# Patient Record
Sex: Female | Born: 1975
Health system: Southern US, Community
[De-identification: ages and names within clinical notes are randomized; demographics above are authoritative.]

## PROBLEM LIST (undated history)

## (undated) DIAGNOSIS — Z789 Other specified health status: Secondary | ICD-10-CM

## (undated) HISTORY — PX: MANDIBLE RECONSTRUCTION: SHX431

## (undated) HISTORY — PX: WISDOM TOOTH EXTRACTION: SHX21

---

## 2002-02-22 ENCOUNTER — Other Ambulatory Visit: Admission: RE | Admit: 2002-02-22 | Discharge: 2002-02-22 | Payer: Self-pay | Admitting: Obstetrics and Gynecology

## 2003-02-27 ENCOUNTER — Other Ambulatory Visit: Admission: RE | Admit: 2003-02-27 | Discharge: 2003-02-27 | Payer: Self-pay | Admitting: Obstetrics and Gynecology

## 2004-02-27 ENCOUNTER — Other Ambulatory Visit: Admission: RE | Admit: 2004-02-27 | Discharge: 2004-02-27 | Payer: Self-pay | Admitting: Obstetrics and Gynecology

## 2005-02-26 ENCOUNTER — Other Ambulatory Visit: Admission: RE | Admit: 2005-02-26 | Discharge: 2005-02-26 | Payer: Self-pay | Admitting: Obstetrics and Gynecology

## 2005-05-31 ENCOUNTER — Inpatient Hospital Stay (HOSPITAL_COMMUNITY): Admission: AD | Admit: 2005-05-31 | Discharge: 2005-06-03 | Payer: Self-pay | Admitting: Obstetrics and Gynecology

## 2006-03-04 ENCOUNTER — Other Ambulatory Visit: Admission: RE | Admit: 2006-03-04 | Discharge: 2006-03-04 | Payer: Self-pay | Admitting: Obstetrics and Gynecology

## 2011-02-12 LAB — GC/CHLAMYDIA PROBE AMP, GENITAL

## 2011-02-12 LAB — RPR: RPR: NONREACTIVE

## 2011-02-12 LAB — HEPATITIS B SURFACE ANTIGEN: Hepatitis B Surface Ag: NEGATIVE

## 2011-06-30 ENCOUNTER — Inpatient Hospital Stay (HOSPITAL_COMMUNITY)
Admission: AD | Admit: 2011-06-30 | Discharge: 2011-06-30 | Disposition: A | Discharge status: Home / Self Care | Payer: 59 | Source: Ambulatory Visit | Attending: Obstetrics and Gynecology | Admitting: Obstetrics and Gynecology

## 2011-06-30 DIAGNOSIS — Z2989 Encounter for other specified prophylactic measures: Secondary | ICD-10-CM | POA: Insufficient documentation

## 2011-06-30 DIAGNOSIS — Z3480 Encounter for supervision of other normal pregnancy, unspecified trimester: Secondary | ICD-10-CM | POA: Insufficient documentation

## 2011-06-30 DIAGNOSIS — Z298 Encounter for other specified prophylactic measures: Secondary | ICD-10-CM | POA: Insufficient documentation

## 2011-06-30 MED ORDER — RHO D IMMUNE GLOBULIN 1500 UNIT/2ML IJ SOLN
300.0000 ug | Freq: Once | INTRAMUSCULAR | Status: AC
  Administered 2011-06-30: 300 ug via INTRAMUSCULAR
  Filled 2011-06-30: qty 2

## 2011-06-30 NOTE — Plan of Care (Signed)
Rhophylac information booklet given to the patient. Explained the time required to process this order of 1 1/2 hours. Pt understands.

## 2011-07-01 LAB — RH IG WORKUP (INCLUDES ABO/RH)
ABO/RH(D): O NEG
Antibody Screen: NEGATIVE
Unit division: 0

## 2011-09-14 NOTE — L&D Delivery Note (Signed)
Delivery Note G2P1 40 6/7At 7:49 PM a viable female was delivered via Vaginal, Spontaneous Delivery (Presentation: ; Left Occiput Anterior).  APGAR:8 , 9; weight not done yet Placenta status: Intact, Spontaneous schultze marginal insertion.  Cord: 3 vessels. Anesthesia: Epidural  Episiotomy: None Lacerations: None hospitala cord blood collection done with Dr. Stefano Gaul attending. GBS+  Est. Blood Loss (mL): 300  Mom to postpartum.  Baby to rooming in.  Rachael King 09/23/2011, 8:10 PM

## 2011-09-23 ENCOUNTER — Inpatient Hospital Stay (HOSPITAL_COMMUNITY)
Admission: AD | Admit: 2011-09-23 | Discharge: 2011-09-25 | DRG: 775 | Disposition: A | Discharge status: Home / Self Care | Payer: 59 | Source: Ambulatory Visit | Attending: Obstetrics and Gynecology | Admitting: Obstetrics and Gynecology

## 2011-09-23 ENCOUNTER — Encounter (HOSPITAL_COMMUNITY): Payer: Self-pay | Admitting: Anesthesiology

## 2011-09-23 ENCOUNTER — Encounter (HOSPITAL_COMMUNITY): Payer: Self-pay | Admitting: Obstetrics and Gynecology

## 2011-09-23 ENCOUNTER — Encounter (HOSPITAL_COMMUNITY): Payer: Self-pay

## 2011-09-23 ENCOUNTER — Inpatient Hospital Stay (HOSPITAL_COMMUNITY): Payer: 59 | Admitting: Anesthesiology

## 2011-09-23 ENCOUNTER — Telehealth (HOSPITAL_COMMUNITY): Payer: Self-pay | Admitting: *Deleted

## 2011-09-23 DIAGNOSIS — Z6791 Unspecified blood type, Rh negative: Secondary | ICD-10-CM | POA: Diagnosis present

## 2011-09-23 DIAGNOSIS — O09529 Supervision of elderly multigravida, unspecified trimester: Secondary | ICD-10-CM | POA: Diagnosis present

## 2011-09-23 DIAGNOSIS — O4290 Premature rupture of membranes, unspecified as to length of time between rupture and onset of labor, unspecified weeks of gestation: Principal | ICD-10-CM | POA: Diagnosis present

## 2011-09-23 DIAGNOSIS — Z3490 Encounter for supervision of normal pregnancy, unspecified, unspecified trimester: Secondary | ICD-10-CM

## 2011-09-23 DIAGNOSIS — IMO0002 Reserved for concepts with insufficient information to code with codable children: Secondary | ICD-10-CM | POA: Diagnosis present

## 2011-09-23 HISTORY — DX: Other specified health status: Z78.9

## 2011-09-23 LAB — CBC
MCV: 95.3 fL (ref 78.0–100.0)
Platelets: 172 10*3/uL (ref 150–400)
RBC: 3.8 MIL/uL — ABNORMAL LOW (ref 3.87–5.11)
RDW: 13.9 % (ref 11.5–15.5)
WBC: 12 10*3/uL — ABNORMAL HIGH (ref 4.0–10.5)

## 2011-09-23 LAB — GC/CHLAMYDIA PROBE AMP, GENITAL

## 2011-09-23 LAB — RPR: RPR Ser Ql: NONREACTIVE

## 2011-09-23 LAB — AMNISURE RUPTURE OF MEMBRANE (ROM) NOT AT ARMC: Amnisure ROM: POSITIVE

## 2011-09-23 MED ORDER — ZOLPIDEM TARTRATE 5 MG PO TABS: 5.0000 mg | ORAL_TABLET | Freq: Every evening | ORAL | Status: DC | PRN

## 2011-09-23 MED ORDER — ONDANSETRON HCL 4 MG/2ML IJ SOLN: 4.0000 mg | Freq: Four times a day (QID) | INTRAMUSCULAR | Status: DC | PRN

## 2011-09-23 MED ORDER — LIDOCAINE HCL 1.5 % IJ SOLN
INTRAMUSCULAR | Status: DC | PRN
  Administered 2011-09-23 (×2): 5 mL via EPIDURAL

## 2011-09-23 MED ORDER — DIPHENHYDRAMINE HCL 25 MG PO CAPS: 25.0000 mg | ORAL_CAPSULE | Freq: Four times a day (QID) | ORAL | Status: DC | PRN

## 2011-09-23 MED ORDER — PRENATAL MULTIVITAMIN CH
1.0000 | ORAL_TABLET | Freq: Every day | ORAL | Status: DC
Start: 2011-09-24 — End: 2011-09-25
  Administered 2011-09-24 – 2011-09-25 (×2): 1 via ORAL
  Filled 2011-09-23 (×2): qty 1

## 2011-09-23 MED ORDER — CITRIC ACID-SODIUM CITRATE 334-500 MG/5ML PO SOLN: 30.0000 mL | ORAL | Status: DC | PRN

## 2011-09-23 MED ORDER — FENTANYL 2.5 MCG/ML BUPIVACAINE 1/10 % EPIDURAL INFUSION (WH - ANES)
INTRAMUSCULAR | Status: DC | PRN
  Administered 2011-09-23: 14 mL/h via EPIDURAL

## 2011-09-23 MED ORDER — PHENYLEPHRINE 40 MCG/ML (10ML) SYRINGE FOR IV PUSH (FOR BLOOD PRESSURE SUPPORT): 80.0000 ug | PREFILLED_SYRINGE | INTRAVENOUS | Status: DC | PRN

## 2011-09-23 MED ORDER — ONDANSETRON HCL 4 MG/2ML IJ SOLN: 4.0000 mg | INTRAMUSCULAR | Status: DC | PRN

## 2011-09-23 MED ORDER — LACTATED RINGERS IV SOLN: 500.0000 mL | Freq: Once | INTRAVENOUS | Status: DC

## 2011-09-23 MED ORDER — IBUPROFEN 600 MG PO TABS: 600.0000 mg | ORAL_TABLET | Freq: Four times a day (QID) | ORAL | Status: DC | PRN

## 2011-09-23 MED ORDER — OXYTOCIN 20 UNITS IN LACTATED RINGERS INFUSION - SIMPLE
125.0000 mL/h | Freq: Once | INTRAVENOUS | Status: AC
  Administered 2011-09-23: 999 mL/h via INTRAVENOUS

## 2011-09-23 MED ORDER — WITCH HAZEL-GLYCERIN EX PADS: 1.0000 "application " | MEDICATED_PAD | CUTANEOUS | Status: DC | PRN

## 2011-09-23 MED ORDER — ONDANSETRON HCL 4 MG PO TABS: 4.0000 mg | ORAL_TABLET | ORAL | Status: DC | PRN

## 2011-09-23 MED ORDER — OXYTOCIN 20 UNITS IN LACTATED RINGERS INFUSION - SIMPLE
1.0000 m[IU]/min | INTRAVENOUS | Status: DC
  Administered 2011-09-23: 2 m[IU]/min via INTRAVENOUS
  Filled 2011-09-23: qty 1000

## 2011-09-23 MED ORDER — LIDOCAINE HCL (PF) 1 % IJ SOLN: 30.0000 mL | INTRAMUSCULAR | Status: DC | PRN

## 2011-09-23 MED ORDER — EPHEDRINE 5 MG/ML INJ
10.0000 mg | INTRAVENOUS | Status: DC | PRN
  Filled 2011-09-23: qty 4

## 2011-09-23 MED ORDER — BENZOCAINE-MENTHOL 20-0.5 % EX AERO
1.0000 "application " | INHALATION_SPRAY | CUTANEOUS | Status: DC | PRN
  Administered 2011-09-24: 1 via TOPICAL

## 2011-09-23 MED ORDER — SIMETHICONE 80 MG PO CHEW: 80.0000 mg | CHEWABLE_TABLET | ORAL | Status: DC | PRN

## 2011-09-23 MED ORDER — IBUPROFEN 600 MG PO TABS
600.0000 mg | ORAL_TABLET | Freq: Four times a day (QID) | ORAL | Status: DC
  Administered 2011-09-23 – 2011-09-25 (×6): 600 mg via ORAL
  Filled 2011-09-23 (×6): qty 1

## 2011-09-23 MED ORDER — TETANUS-DIPHTH-ACELL PERTUSSIS 5-2.5-18.5 LF-MCG/0.5 IM SUSP
0.5000 mL | Freq: Once | INTRAMUSCULAR | Status: AC
  Administered 2011-09-24: 0.5 mL via INTRAMUSCULAR
  Filled 2011-09-23: qty 0.5

## 2011-09-23 MED ORDER — ACETAMINOPHEN 325 MG PO TABS: 650.0000 mg | ORAL_TABLET | ORAL | Status: DC | PRN

## 2011-09-23 MED ORDER — DIBUCAINE 1 % RE OINT: 1.0000 "application " | TOPICAL_OINTMENT | RECTAL | Status: DC | PRN

## 2011-09-23 MED ORDER — EPHEDRINE 5 MG/ML INJ: 10.0000 mg | INTRAVENOUS | Status: DC | PRN

## 2011-09-23 MED ORDER — LANOLIN HYDROUS EX OINT: TOPICAL_OINTMENT | CUTANEOUS | Status: DC | PRN

## 2011-09-23 MED ORDER — FLEET ENEMA 7-19 GM/118ML RE ENEM: 1.0000 | ENEMA | RECTAL | Status: DC | PRN

## 2011-09-23 MED ORDER — LACTATED RINGERS IV SOLN
INTRAVENOUS | Status: DC
  Administered 2011-09-23 (×2): via INTRAVENOUS

## 2011-09-23 MED ORDER — OXYCODONE-ACETAMINOPHEN 5-325 MG PO TABS: 1.0000 | ORAL_TABLET | ORAL | Status: DC | PRN

## 2011-09-23 MED ORDER — SENNOSIDES-DOCUSATE SODIUM 8.6-50 MG PO TABS
2.0000 | ORAL_TABLET | Freq: Every day | ORAL | Status: DC
  Administered 2011-09-24: 2 via ORAL

## 2011-09-23 MED ORDER — PHENYLEPHRINE 40 MCG/ML (10ML) SYRINGE FOR IV PUSH (FOR BLOOD PRESSURE SUPPORT)
80.0000 ug | PREFILLED_SYRINGE | INTRAVENOUS | Status: DC | PRN
  Filled 2011-09-23: qty 5

## 2011-09-23 MED ORDER — DIPHENHYDRAMINE HCL 50 MG/ML IJ SOLN: 12.5000 mg | INTRAMUSCULAR | Status: DC | PRN

## 2011-09-23 MED ORDER — OXYTOCIN BOLUS FROM INFUSION
500.0000 mL | Freq: Once | INTRAVENOUS | Status: DC
  Filled 2011-09-23: qty 500

## 2011-09-23 MED ORDER — FENTANYL 2.5 MCG/ML BUPIVACAINE 1/10 % EPIDURAL INFUSION (WH - ANES)
14.0000 mL/h | INTRAMUSCULAR | Status: DC
  Administered 2011-09-23: 14 mL/h via EPIDURAL
  Filled 2011-09-23 (×2): qty 60

## 2011-09-23 MED ORDER — BUTORPHANOL TARTRATE 2 MG/ML IJ SOLN: 1.0000 mg | INTRAMUSCULAR | Status: DC | PRN

## 2011-09-23 MED ORDER — OXYCODONE-ACETAMINOPHEN 5-325 MG PO TABS: 2.0000 | ORAL_TABLET | ORAL | Status: DC | PRN

## 2011-09-23 MED ORDER — TERBUTALINE SULFATE 1 MG/ML IJ SOLN: 0.2500 mg | Freq: Once | INTRAMUSCULAR | Status: DC | PRN

## 2011-09-23 MED ORDER — LACTATED RINGERS IV SOLN
500.0000 mL | INTRAVENOUS | Status: DC | PRN
  Administered 2011-09-23: 1000 mL via INTRAVENOUS

## 2011-09-23 MED ORDER — ZOLPIDEM TARTRATE 10 MG PO TABS: 10.0000 mg | ORAL_TABLET | Freq: Every evening | ORAL | Status: DC | PRN

## 2011-09-23 NOTE — Anesthesia Preprocedure Evaluation (Signed)

## 2011-09-23 NOTE — Progress Notes (Signed)
Patient states she started leaking fluid at 0200, started as brownish/red now clear fluid. Reports no contractions, and reports good fetal movement.

## 2011-09-23 NOTE — Anesthesia Procedure Notes (Signed)
Epidural Patient location during procedure: OB Start time: 09/23/2011 5:39 PM End time: 09/23/2011 5:44 PM Reason for block: procedure for pain  Staffing Anesthesiologist: Sandrea Hughs Performed by: anesthesiologist   Preanesthetic Checklist Completed: patient identified, site marked, surgical consent, pre-op evaluation, timeout performed, IV checked, risks and benefits discussed and monitors and equipment checked  Epidural Patient position: sitting Prep: site prepped and draped and DuraPrep Patient monitoring: continuous pulse ox and blood pressure Approach: midline Injection technique: LOR air  Needle:  Needle type: Tuohy  Needle gauge: 17 G Needle length: 9 cm Needle insertion depth: 5 cm cm Catheter type: closed end flexible Catheter size: 19 Gauge Catheter at skin depth: 10 cm Test dose: negative and 1.5% lidocaine  Assessment Sensory level: T8 Events: blood not aspirated, injection not painful, no injection resistance, negative IV test and no paresthesia

## 2011-09-23 NOTE — Telephone Encounter (Signed)
Preadmission screen  

## 2011-09-23 NOTE — Progress Notes (Signed)
  Subjective: Comfortable, feeling some mild cramping.  Still leaking clear fluid.  Objective: BP 128/62  Pulse 93  Temp(Src) 98.3 F (36.8 C) (Axillary)  Resp 20  Ht 5\' 5"  (1.651 m)  Wt 170 lb (77.111 kg)  BMI 28.29 kg/m2  SpO2 99%     FHT:  Category 1 UC:   Irregular, mild, no pattern Pitocin on 8 mu/min  Assessment / Plan: PROM at term, no labor GBS negative Continue pitocin.  Nigel Bridgeman 09/23/2011, 2:31 PM

## 2011-09-23 NOTE — ED Provider Notes (Signed)
Rachael King is a 36 y.o. female presenting at 54 6/7 weeks, G2P1001, with SROM at 2am, clear fluid, minimal contractions.  Reports +FM, denies bleeding.     Pregnancy remarkable for: RH negative--received Rhophylac at 28 weeks AMA--normal screening, no amnio   History of present pregnancy: Patient entered care at 5-6 weeks.  EDC of 09/17/11 was established by 11 week [place].  Anatomy scan was done at 18 5/7 weeks, with normal findings and an posterior placenta.  Her prenatal course was essentially uncomplicated. At her last evalution, she was 1 cm.    OB History      Grav  Para  Term  Preterm  Abortions  TAB  SAB  Ect  Mult  Living     2  1  1              1        #1--2006. SVB female 7+7 induced at 42 weeks.  5 hour labor, epidural.  + GBS #2 Current    Past Medical History   Diagnosis  Date   .  No pertinent past medical history      Past Surgical History   Procedure  Date   .  Wisdom tooth extraction     .  Mandible reconstruction      Family History: family history is not on file. Father heart disease and hypertension.  Mother migraines.  MGM alzheimers.    Social History: reports that she has never smoked. She has never used smokeless tobacco. She reports that she does not drink alcohol or use illicit drugs. FOB involved and supportive, Rachael King, high school educated, full-time employed.  Patient is college-educated, employed as Pharmacist, community at [employer].  Followed by CNM service.  Caucasian, of the Catholic faith   Dilation: 1.5 Effacement (%): 70 Station: -2 Exam by:: Rachael King,cnm Leaking clear fluid Blood pressure 136/82, pulse 113, temperature 98.4 F (36.9 C), temperature source Oral, resp. rate 20, height 5\' 5"  (1.651 m), weight 170 lb (77.111 kg), SpO2 99.00%.   FHR reactive, no decels. Irregular mild contractions.   Amnisure positive in MAU   Prenatal labs: ABO, Rh: --/--/O NEG (10/17 1703) Antibody: NEG (10/17 1703) Rubella: Immune RPR: Nonreactive  (06/01 0000)   HBsAg: Negative (06/01 0000)   HIV: Non-reactive (06/01 0000)   GBS: Negative (12/19 0000)  1st trimester screen and AFP WNL Glucola 101. Hgb 13/11.6 at 28 weeks   Assessment/Plan: IUP at 40 6/7 weeks SROM, minimal labor GBS negative. RH negative   Plan: Admit to Birthing Suite per consult with Dr. Stefano Gaul Routine CNM orders Recommend pitocin augmentation--patient agreeable with plan Plans epidural as labor advances.   Dawanna Grauberger 09/23/2011, 11:04 AM           Revision History...     Date/Time User Action   09/23/2011 11:28 AM Nigel Bridgeman, CNM Sign   09/23/2011 11:17 AM Nigel Bridgeman, CNM Sign  View Details Report

## 2011-09-23 NOTE — Progress Notes (Signed)
Per CNM pt can have 20 mins off monitor to walk around unit

## 2011-09-23 NOTE — H&P (Signed)
Rachael King is a 36 y.o. female presenting at 61 6/7 weeks, G2P1001, with SROM at 2am, clear fluid, minimal contractions.  Reports +FM, denies bleeding.    Pregnancy remarkable for: RH negative--received Rhophylac at 28 weeks AMA--normal screening, no amnio  History of present pregnancy: Patient entered care at 5-6 weeks.  EDC of 09/17/11 was established by 11 week [place].  Anatomy scan was done at 18 5/7 weeks, with normal findings and an posterior placenta.  Her prenatal course was essentially uncomplicated. At her last evalution, she was 1 cm.  OB History    Grav Para Term Preterm Abortions TAB SAB Ect Mult Living   2 1 1       1     #1--2006. SVB female 7+7 induced at 42 weeks.  5 hour labor, epidural.  + GBS #2 Current  Past Medical History  Diagnosis Date  . No pertinent past medical history    Past Surgical History  Procedure Date  . Wisdom tooth extraction   . Mandible reconstruction    Family History: family history is not on file. Father heart disease and hypertension.  Mother migraines.  MGM alzheimers.   Social History:  reports that she has never smoked. She has never used smokeless tobacco. She reports that she does not drink alcohol or use illicit drugs. FOB involved and supportive, Rachael King, high school educated, full-time employed.  Patient is college-educated, employed as Pharmacist, community at [employer].  Followed by CNM service.  Caucasian, of the Catholic faith  Dilation: 1.5 Effacement (%): 70 Station: -2 Exam by:: V Rachael King,cnm Leaking clear fluid Blood pressure 136/82, pulse 113, temperature 98.4 F (36.9 C), temperature source Oral, resp. rate 20, height 5\' 5"  (1.651 m), weight 170 lb (77.111 kg), SpO2 99.00%.  FHR reactive, no decels. Irregular mild contractions.  Amnisure positive in MAU  Prenatal labs: ABO, Rh: --/--/O NEG (10/17 1703) Antibody: NEG (10/17 1703) Rubella:  Immune RPR: Nonreactive (06/01 0000)  HBsAg: Negative (06/01 0000)  HIV:  Non-reactive (06/01 0000)  GBS: Negative (12/19 0000)  1st trimester screen and AFP WNL Glucola 101. Hgb 13/11.6 at 28 weeks  Assessment/Plan: IUP at 40 6/7 weeks SROM, minimal labor GBS negative. RH negative  Plan: Admit to Birthing Suite per consult with Dr. Stefano King Routine CNM orders Recommend pitocin augmentation--patient agreeable with plan Plans epidural as labor advances.  Rachael King 09/23/2011, 11:04 AM

## 2011-09-24 ENCOUNTER — Encounter (HOSPITAL_COMMUNITY): Payer: Self-pay | Admitting: *Deleted

## 2011-09-24 LAB — CBC
HCT: 35.5 % — ABNORMAL LOW (ref 36.0–46.0)
Hemoglobin: 12.1 g/dL (ref 12.0–15.0)
MCHC: 34.1 g/dL (ref 30.0–36.0)
RBC: 3.68 MIL/uL — ABNORMAL LOW (ref 3.87–5.11)

## 2011-09-24 LAB — RH IG WORKUP (INCLUDES ABO/RH)
ABO/RH(D): O NEG
Fetal Screen: NEGATIVE
Gestational Age(Wks): 40.6

## 2011-09-24 MED ORDER — BENZOCAINE-MENTHOL 20-0.5 % EX AERO
INHALATION_SPRAY | CUTANEOUS | Status: AC
  Administered 2011-09-24: 1 via TOPICAL
  Filled 2011-09-24: qty 56

## 2011-09-24 MED ORDER — RHO D IMMUNE GLOBULIN 1500 UNIT/2ML IJ SOLN
300.0000 ug | Freq: Once | INTRAMUSCULAR | Status: AC
  Administered 2011-09-24: 300 ug via INTRAMUSCULAR
  Filled 2011-09-24: qty 2

## 2011-09-24 NOTE — Anesthesia Postprocedure Evaluation (Signed)
  Anesthesia Post-op Note  Patient: Rachael King  Procedure(s) Performed: * No procedures listed *  Patient Location: Mother/Baby  Anesthesia Type: Epidural  Level of Consciousness: awake, alert  and oriented  Airway and Oxygen Therapy: Patient Spontanous Breathing  Post-op Pain: none  Post-op Assessment: Post-op Vital signs reviewed, Patient's Cardiovascular Status Stable, No headache, No backache, No residual numbness and No residual motor weakness  Post-op Vital Signs: Reviewed and stable  Complications: No apparent anesthesia complications

## 2011-09-24 NOTE — Progress Notes (Signed)
Patient ID: Rachael King, female   DOB: [birth year], 36 y.o.   MRN: [id number] Post Partum Day 1 Subjective: no complaints, up ad lib without syncope, voiding, tolerating PO, + flatus  Pain well controlled with po meds BF well Mood stable, bonding well   Objective: Blood pressure 105/60, pulse 71, temperature 98.2 F (36.8 C), temperature source Oral, resp. rate 18, height 5\' 5"  (1.651 m), weight 77.111 kg (170 lb), SpO2 99.00%, unknown if currently breastfeeding.  Physical Exam:  General: alert and no distress Lungs: CTAB Heart: RRR Breasts: soft, intact Lochia: appropriate Uterine Fundus: firm Perineum: WNL DVT Evaluation: No evidence of DVT seen on physical exam. Negative Homan's sign. No significant calf/ankle edema.   Basename 09/24/11 0540 09/23/11 1150  HGB 12.1 12.7  HCT 35.5* 36.2    Assessment/Plan: Plan for discharge tomorrow, Breastfeeding, Lactation consult and Contraception undecided, considering mirena and vasectomy       LOS: 1 day   Leon Montoya M 09/24/2011, 11:13 AM

## 2011-09-25 MED ORDER — IBUPROFEN 600 MG PO TABS: 600.0000 mg | ORAL_TABLET | Freq: Four times a day (QID) | ORAL | Status: AC

## 2011-09-25 NOTE — Progress Notes (Signed)
Post Partum Day 2 Subjective: Reports feeling well.  Ambulating, voiding and tol po liquids and solids without difficulty.  Passing flatus but no BM yet.  Minimal pain and lochia is improving.  Breastfeeding without difficulty.    Objective: Blood pressure 111/71, pulse 85, temperature 98.3 F (36.8 C), temperature source Oral, resp. rate 18, height 5\' 5"  (1.651 m), weight 77.111 kg (170 lb), SpO2 98.00%, unknown if currently breastfeeding.  Physical Exam:  General: alert, cooperative and no distress Heart:  RRR Lungs:  CTA bilat Breasts:  Soft Abd:  Soft/nontender with pos BS x 4 quads Lochia: appropriate, scant rubra Uterine Fundus: firm.  Nontender, 2 below umbilicus Incision: N/A DVT Evaluation: No evidence of DVT seen on physical exam. Negative Homan's sign bilat. No significant calf/ankle edema.   Basename 09/24/11 0540 09/23/11 1150  HGB 12.1 12.7  HCT 35.5* 36.2    Assessment/Plan: Stable s/p vaginal delivery at term  Will discharge to home and discharge instructions reviewed.  Pt declines rx for percocet at discharge.  Rx Motrin given. Plans Mirena for contraception and will sched 4 wk f/u.   LOS: 2 days   Autumm Hattery O. 09/25/2011, 11:10 AM

## 2011-09-25 NOTE — Discharge Summary (Signed)
Obstetric Discharge Summary Reason for Admission:  SROM Prenatal Procedures: ultrasound Intrapartum Procedures: spontaneous vaginal delivery Postpartum Procedures: none Complications-Operative and Postpartum: none Hemoglobin  Date Value Range Status  09/24/2011 12.1  12.0-15.0 (g/dL) Final     HCT  Date Value Range Status  09/24/2011 35.5* 36.0-46.0 (%) Final    Discharge Diagnoses: Term Pregnancy-delivered  Discharge Information: Date: 09/25/2011 Activity: unrestricted Diet: routine Medications: Ibuprofen Condition: stable Instructions: refer to practice specific booklet Discharge to: home Follow-up Information    Follow up with [facility] in 4 weeks.        Contraception:  Plans Mirena  Newborn Data: Live born female  Birth Weight: 7 lb 3.3 oz (3270 g) APGAR: 8, 9  Home with mother.  Jodeci Roarty O. 09/25/2011, 11:06 AM

## 2011-09-26 ENCOUNTER — Inpatient Hospital Stay (HOSPITAL_COMMUNITY): Admission: RE | Admit: 2011-09-26 | Payer: 59 | Source: Ambulatory Visit

## 2011-11-12 ENCOUNTER — Encounter (INDEPENDENT_AMBULATORY_CARE_PROVIDER_SITE_OTHER): Payer: Commercial Managed Care - PPO | Admitting: Obstetrics and Gynecology

## 2011-11-12 DIAGNOSIS — Z30430 Encounter for insertion of intrauterine contraceptive device: Secondary | ICD-10-CM

## 2011-12-09 ENCOUNTER — Encounter (INDEPENDENT_AMBULATORY_CARE_PROVIDER_SITE_OTHER): Payer: Commercial Managed Care - PPO | Admitting: Obstetrics and Gynecology

## 2011-12-09 DIAGNOSIS — Z3040 Encounter for surveillance of contraceptives, unspecified: Secondary | ICD-10-CM

## 2012-07-10 ENCOUNTER — Telehealth: Payer: Self-pay | Admitting: Obstetrics and Gynecology

## 2012-07-10 NOTE — Telephone Encounter (Signed)
Spoke to pt who has concerns about her Mirena. She states that her strings feel like they are longer than they were the last time that she checked it, and she's been having heavier bleeding and some cramps. She thinks it may be expelling. Appt booked for Friday w/ EP. Pt requested appt date and time. Melody Comas A

## 2012-07-14 ENCOUNTER — Ambulatory Visit (INDEPENDENT_AMBULATORY_CARE_PROVIDER_SITE_OTHER): Payer: 59 | Admitting: Obstetrics and Gynecology

## 2012-07-14 ENCOUNTER — Encounter: Payer: Self-pay | Admitting: Obstetrics and Gynecology

## 2012-07-14 VITALS — BP 102/70 | HR 70 | Wt 137.0 lb

## 2012-07-14 DIAGNOSIS — IMO0001 Reserved for inherently not codable concepts without codable children: Secondary | ICD-10-CM

## 2012-07-14 DIAGNOSIS — Z30431 Encounter for routine checking of intrauterine contraceptive device: Secondary | ICD-10-CM

## 2012-07-14 DIAGNOSIS — Z309 Encounter for contraceptive management, unspecified: Secondary | ICD-10-CM

## 2012-07-14 DIAGNOSIS — N926 Irregular menstruation, unspecified: Secondary | ICD-10-CM

## 2012-07-14 NOTE — Progress Notes (Signed)
36 YO with Mirena inserted March 2013 and spotting since.  Stopped breastfeeding in July and spotting is heavier since then.  Denies any cramping except with a "period" 06/27/12.   O: Abdomen. soft, non-tender      Pelvic: EGBUS-wnl, vagina-scant blood, cervix-no lesions, string appears long, uterus/adnexae-no tenderness or masses  UPT-negative  A: Persistent spotting with Mirena  P: Pelvic U/S for IUD Placement     CBC-pending      RTO-as scheduled or prn  Derell Bruun, PA-C

## 2012-07-15 LAB — CBC
HCT: 39.2 % (ref 36.0–46.0)
Hemoglobin: 13.6 g/dL (ref 12.0–15.0)
MCH: 31.1 pg (ref 26.0–34.0)
MCHC: 34.7 g/dL (ref 30.0–36.0)
MCV: 89.7 fL (ref 78.0–100.0)
RBC: 4.37 MIL/uL (ref 3.87–5.11)

## 2012-07-28 ENCOUNTER — Ambulatory Visit (INDEPENDENT_AMBULATORY_CARE_PROVIDER_SITE_OTHER): Payer: 59 | Admitting: Obstetrics and Gynecology

## 2012-07-28 ENCOUNTER — Encounter: Payer: Self-pay | Admitting: Obstetrics and Gynecology

## 2012-07-28 ENCOUNTER — Ambulatory Visit (INDEPENDENT_AMBULATORY_CARE_PROVIDER_SITE_OTHER): Payer: 59

## 2012-07-28 VITALS — BP 94/68 | Resp 16 | Ht 65.0 in | Wt 135.0 lb

## 2012-07-28 DIAGNOSIS — N926 Irregular menstruation, unspecified: Secondary | ICD-10-CM

## 2012-07-28 DIAGNOSIS — Z30431 Encounter for routine checking of intrauterine contraceptive device: Secondary | ICD-10-CM

## 2012-07-28 NOTE — Progress Notes (Signed)
36 YO with Mirena IUD being evaluated for irregular bleeding.  CBC was normal and pregnancy test-negative.  O: U/S-7.96 x 4.70 x3.87 cm endometrium-0.343 cm; normal appearing ovaries and IUD within endometrial cavity properly per 3D rendering  A:  Irregular Bleeding with Mirena  P:  Patient states she's not had any more bleeding since her last visit and will observe for now       If irregular bleeding resumes within the next 8 weeks may prescribe Estradiol 1 mg daly for 21 days        RTO-as scheduled or prn  Kahli Fitzgerald, PA-C

## 2012-08-07 ENCOUNTER — Telehealth: Payer: Self-pay | Admitting: Obstetrics and Gynecology

## 2012-08-07 MED ORDER — ESTRADIOL 1 MG PO TABS: 1.0000 mg | ORAL_TABLET | Freq: Every day | ORAL | Status: DC

## 2012-08-07 NOTE — Telephone Encounter (Signed)
LVM for pt to advise rx sent to pharmacy Estradiol 1 mg po qd # 21 x 0. Per EP from visit note on 07/28/12 Darien Ramus

## 2012-09-01 ENCOUNTER — Encounter: Payer: Self-pay | Admitting: Obstetrics and Gynecology

## 2012-09-01 ENCOUNTER — Ambulatory Visit (INDEPENDENT_AMBULATORY_CARE_PROVIDER_SITE_OTHER): Payer: 59 | Admitting: Obstetrics and Gynecology

## 2012-09-01 ENCOUNTER — Telehealth: Payer: Self-pay | Admitting: Obstetrics and Gynecology

## 2012-09-01 VITALS — BP 112/64 | Ht 65.0 in | Wt 133.0 lb

## 2012-09-01 DIAGNOSIS — Z309 Encounter for contraceptive management, unspecified: Secondary | ICD-10-CM

## 2012-09-01 DIAGNOSIS — Z30432 Encounter for removal of intrauterine contraceptive device: Secondary | ICD-10-CM

## 2012-09-01 NOTE — Telephone Encounter (Signed)
Tc to pt regarding msg.  Lm on vm to call back asap.

## 2012-09-01 NOTE — Progress Notes (Signed)
Subjective:    Rachael King is a 35 y.o. female, [id number], who presents for problems with IUD. States that she believes a piece of her string came out yesterday when removing tampon. States that cycle started Monday. She is unsure if it is a cycle or if it due to mirena. States that cycle was heavier yesterday. No clotting. No pain/cramping. Patient request to have Mirena removed.   She says husband will get vasectomy and she wants nuvaring in the meantime.   The following portions of the patient's history were reviewed and updated as appropriate: allergies, current medications, past family history.  Review of Systems Pertinent items are noted in HPI. Breast:Negative for breast lump,nipple discharge or nipple retraction Gastrointestinal: Negative for abdominal pain, change in bowel habits or rectal bleeding Urinary:negative   Objective:    BP 112/64  Ht 5\' 5"  (1.651 m)  Wt 133 lb (60.328 kg)  BMI 22.13 kg/m2    Weight:  Wt Readings from Last 1 Encounters:  09/01/12 133 lb (60.328 kg)          BMI: Body mass index is 22.13 kg/(m^2).  General Appearance: Alert, appropriate appearance for age. No acute distress GYN exam: IUD string visible and IUD removed without difficulty  Assessment:    Dysfunctional uterine bleeding  with Mirena IUD   Plan:    return for annual exam or prn Nuvaring samples given and Rx sent

## 2012-09-01 NOTE — Telephone Encounter (Signed)
Pt returned call to office.  Pt states she had a tampon in and when she had taken it out notices about an inch of something hanging out which the pt felt like it is the IUD string.  Pt states is not very pleased with the IUD and would be fine with getting it taken out.  Per SR pt may come into office today for eval.  Pt scheduled today @ 1330, pt voices agreement.

## 2012-10-04 ENCOUNTER — Other Ambulatory Visit: Payer: Self-pay

## 2012-10-04 ENCOUNTER — Telehealth: Payer: Self-pay | Admitting: Obstetrics and Gynecology

## 2012-10-04 MED ORDER — ETONOGESTREL-ETHINYL ESTRADIOL 0.12-0.015 MG/24HR VA RING: VAGINAL_RING | VAGINAL | Status: DC

## 2012-10-04 NOTE — Telephone Encounter (Signed)
Lm on vm for pt to call back.

## 2012-10-04 NOTE — Telephone Encounter (Signed)
Pt was seen by DR AR 08/2012 for mirena removal and was given sample of nuvaring. Pt decided she would like to continue nuvaring and asks that rx be sent to her pharmacy. Rx sent to [facility], pt agreeable.

## 2013-03-07 ENCOUNTER — Ambulatory Visit (HOSPITAL_COMMUNITY): Payer: 59 | Admitting: Specialist

## 2013-03-08 ENCOUNTER — Ambulatory Visit (INDEPENDENT_AMBULATORY_CARE_PROVIDER_SITE_OTHER): Payer: 59 | Admitting: Otolaryngology

## 2013-03-08 DIAGNOSIS — H6980 Other specified disorders of Eustachian tube, unspecified ear: Secondary | ICD-10-CM

## 2013-03-08 DIAGNOSIS — J310 Chronic rhinitis: Secondary | ICD-10-CM

## 2013-03-08 DIAGNOSIS — R599 Enlarged lymph nodes, unspecified: Secondary | ICD-10-CM

## 2013-03-12 ENCOUNTER — Encounter: Payer: Self-pay | Admitting: Family Medicine

## 2013-07-19 ENCOUNTER — Other Ambulatory Visit: Payer: Self-pay

## 2014-07-15 ENCOUNTER — Encounter: Payer: Self-pay | Admitting: Obstetrics and Gynecology

## 2014-10-22 ENCOUNTER — Encounter: Payer: Self-pay | Admitting: Nurse Practitioner

## 2014-10-22 ENCOUNTER — Ambulatory Visit (INDEPENDENT_AMBULATORY_CARE_PROVIDER_SITE_OTHER): Payer: 59 | Admitting: Nurse Practitioner

## 2014-10-22 VITALS — BP 131/84 | HR 75 | Ht 65.0 in | Wt 143.0 lb

## 2014-10-22 DIAGNOSIS — G43009 Migraine without aura, not intractable, without status migrainosus: Secondary | ICD-10-CM

## 2014-10-22 DIAGNOSIS — G43909 Migraine, unspecified, not intractable, without status migrainosus: Secondary | ICD-10-CM | POA: Insufficient documentation

## 2014-10-22 MED ORDER — NAPROXEN SODIUM 550 MG PO TABS: 550.0000 mg | ORAL_TABLET | Freq: Two times a day (BID) | ORAL | Status: DC

## 2014-10-22 MED ORDER — SUMATRIPTAN SUCCINATE 100 MG PO TABS: 100.0000 mg | ORAL_TABLET | Freq: Once | ORAL | Status: DC | PRN

## 2014-10-22 NOTE — Patient Instructions (Signed)
Migraine Headache A migraine headache is an intense, throbbing pain on one or both sides of your head. A migraine can last for 30 minutes to several hours. CAUSES  The exact cause of a migraine headache is not always known. However, a migraine may be caused when nerves in the brain become irritated and release chemicals that cause inflammation. This causes pain. Certain things may also trigger migraines, such as:  Alcohol.  Smoking.  Stress.  Menstruation.  Aged cheeses.  Foods or drinks that contain nitrates, glutamate, aspartame, or tyramine.  Lack of sleep.  Chocolate.  Caffeine.  Hunger.  Physical exertion.  Fatigue.  Medicines used to treat chest pain (nitroglycerine), birth control pills, estrogen, and some blood pressure medicines. SIGNS AND SYMPTOMS  Pain on one or both sides of your head.  Pulsating or throbbing pain.  Severe pain that prevents daily activities.  Pain that is aggravated by any physical activity.  Nausea, vomiting, or both.  Dizziness.  Pain with exposure to bright lights, loud noises, or activity.  General sensitivity to bright lights, loud noises, or smells. Before you get a migraine, you may get warning signs that a migraine is coming (aura). An aura may include:  Seeing flashing lights.  Seeing bright spots, halos, or zigzag lines.  Having tunnel vision or blurred vision.  Having feelings of numbness or tingling.  Having trouble talking.  Having muscle weakness. DIAGNOSIS  A migraine headache is often diagnosed based on:  Symptoms.  Physical exam.  A CT scan or MRI of your head. These imaging tests cannot diagnose migraines, but they can help rule out other causes of headaches. TREATMENT Medicines may be given for pain and nausea. Medicines can also be given to help prevent recurrent migraines.  HOME CARE INSTRUCTIONS  Only take over-the-counter or prescription medicines for pain or discomfort as directed by your  health care provider. The use of long-term narcotics is not recommended.  Lie down in a dark, quiet room when you have a migraine.  Keep a journal to find out what may trigger your migraine headaches. For example, write down:  What you eat and drink.  How much sleep you get.  Any change to your diet or medicines.  Limit alcohol consumption.  Quit smoking if you smoke.  Get 7-9 hours of sleep, or as recommended by your health care provider.  Limit stress.  Keep lights dim if bright lights bother you and make your migraines worse. SEEK IMMEDIATE MEDICAL CARE IF:   Your migraine becomes severe.  You have a fever.  You have a stiff neck.  You have vision loss.  You have muscular weakness or loss of muscle control.  You start losing your balance or have trouble walking.  You feel faint or pass out.  You have severe symptoms that are different from your first symptoms. MAKE SURE YOU:   Understand these instructions.  Will watch your condition.  Will get help right away if you are not doing well or get worse. Document Released: 08/30/2005 Document Revised: 01/14/2014 Document Reviewed: 05/07/2013 [facility] Patient Information 2015 [place], [place]. This information is not intended to replace advice given to you by your health care provider. Make sure you discuss any questions you have with your health care provider.

## 2014-10-22 NOTE — Progress Notes (Signed)
Diagnosis: Migraine without Aura  History:  Location: right temple and right occipital  Number of Headache days/month: 10/30 Severe: 1-2 Moderate: 1-2 has trouble deciding between moderate and severe Mild: 8-10  Current Outpatient Prescriptions on File Prior to Visit  Medication Sig Dispense Refill  . calcium carbonate (TUMS - DOSED IN MG ELEMENTAL CALCIUM) 500 MG chewable tablet Chew 2 tablets by mouth daily.    Marland Kitchen etonogestrel-ethinyl estradiol (NUVARING) 0.12-0.015 MG/24HR vaginal ring Insert vaginally and leave in place for 3 consecutive weeks, then remove for 1 week. 1 each 11  . Multiple Vitamin (MULTIVITAMIN) tablet Take 1 tablet by mouth daily.     No current facility-administered medications on file prior to visit.    Acute / prevention: OTC medications  Past Medical History  Diagnosis Date  . No pertinent past medical history    Past Surgical History  Procedure Laterality Date  . Wisdom tooth extraction    . Mandible reconstruction     Family History  Problem Relation Age of Onset  . Heart disease Father     A FIB  . Hypertension Mother     OPEN HEART SURGERY   Social History:  reports that she has never smoked. She has never used smokeless tobacco. She reports that she drinks alcohol. She reports that she does not use illicit drugs. Allergies: No Known Allergies  Triggers: stress  Birth control: Husband had vasectomy and she uses Nuvaring  ROS: Positive for migraine. Negative for   Exam: Well developed, well nourished caucasian female  General: NAD HEENT: Negative Cardiac: RRR Lungs: Clear Neuro: negative Skin:Warm and dry  Impression:migraine - common  Plan: Discussed the pathophysiology of migraine and medication management including risk and benefits. She would just like to have something to treat the moderate to severe migraines and we will use Imitrex. She will add Anaprox as needed. She is advised to follow up on prn basis   Time Spent: 30  minutes

## 2015-02-04 ENCOUNTER — Encounter: Payer: Self-pay | Admitting: Physician Assistant

## 2015-02-04 ENCOUNTER — Ambulatory Visit: Payer: 59 | Admitting: Nurse Practitioner

## 2015-02-04 ENCOUNTER — Ambulatory Visit (INDEPENDENT_AMBULATORY_CARE_PROVIDER_SITE_OTHER): Payer: 59 | Admitting: Physician Assistant

## 2015-02-04 VITALS — BP 120/84 | HR 84 | Ht 66.0 in | Wt 141.0 lb

## 2015-02-04 DIAGNOSIS — Z01419 Encounter for gynecological examination (general) (routine) without abnormal findings: Secondary | ICD-10-CM

## 2015-02-04 DIAGNOSIS — G43001 Migraine without aura, not intractable, with status migrainosus: Secondary | ICD-10-CM | POA: Diagnosis not present

## 2015-02-04 DIAGNOSIS — Z124 Encounter for screening for malignant neoplasm of cervix: Secondary | ICD-10-CM | POA: Diagnosis not present

## 2015-02-04 DIAGNOSIS — Z30018 Encounter for initial prescription of other contraceptives: Secondary | ICD-10-CM | POA: Diagnosis not present

## 2015-02-04 DIAGNOSIS — Z1151 Encounter for screening for human papillomavirus (HPV): Secondary | ICD-10-CM

## 2015-02-04 MED ORDER — ETONOGESTREL-ETHINYL ESTRADIOL 0.12-0.015 MG/24HR VA RING: VAGINAL_RING | VAGINAL | Status: DC

## 2015-02-04 NOTE — Patient Instructions (Signed)

## 2015-02-04 NOTE — Addendum Note (Signed)
Addended by: Erik Obey on: 02/04/2015 05:02 PM   Modules accepted: Orders

## 2015-02-04 NOTE — Progress Notes (Signed)
Patient ID: Rachael King, female   DOB: November 02, 1975, 39 y.o.   MRN: [id number] History:  Rachael King is a 39 y.o. G2P2002 who presents to clinic today for annual physical exam.  She states there are no problems.  She is doing monthly self breast exams.  She uses NuvaRing for double contraception as her husband had vasectomy.  She replaces NuvaRing q 3 weeks to avoid menses and headaches associated with hormonal fluctuations.  This has worked nicely since started in February.  She is having 1 HA per week presently.  No menses since Feb.  Kids are 3 and 9 and no more desired.  Imitrex works well for acute HA without undesired side effects.  The following portions of the patient's history were reviewed and updated as appropriate: allergies, current medications, past family history, past medical history, past social history, past surgical history and problem list.  Review of Systems:  Pertinent ROS in HPI.  All other systems are negative.   Objective:  Physical Exam BP 120/84 mmHg  Pulse 84  Ht 5' 6"  (1.676 m)  Wt 141 lb (63.957 kg)  BMI 22.77 kg/m2 GENERAL: Well-developed, well-nourished female in no acute distress.  HEENT: Normocephalic, atraumatic.  NECK: Supple. Normal thyroid.  LUNGS: Normal rate. Clear to auscultation bilaterally.  HEART: Regular rate and rhythm with no adventitious sounds.  BREASTS: Symmetric in size. No masses, skin changes, nipple drainage, or lymphadenopathy. ABDOMEN: Soft, nontender, nondistended. No organomegaly. Normal bowel sounds appreciated in all quadrants.  PELVIC: Normal external female genitalia. Vagina is pink and rugated.  Normal discharge. Normal cervix contour. Pap smear obtained with bleeding following pap. Uterus is normal in size. No adnexal mass or tenderness.  EXTREMITIES: No cyanosis, clubbing, or edema, 2+ distal pulses.  Labs and Imaging No results found.  Assessment & Plan:  Assessment: Healthy female using NuvaRing for  contraception/period control/HA prevention Migraine without aura  Plans: Continue NuvaRing with replacement q 3 weeks.  Rx provided.  We did discuss the increased risk of stroke on estrogen containing medications.  She desires continued use and she denies tobacco use, h/o clot, h/o aura, BP elevations or other cardiac history.  She will notify the office should that change and d/c use of medication.  Continue Imitrex + NSAID for acute HA  Will need mammogram age 34 Pap pending.    Rachael Stack, PA-C 02/04/2015 9:44 AM

## 2015-02-06 LAB — CYTOLOGY - PAP

## 2015-02-27 ENCOUNTER — Other Ambulatory Visit: Payer: Self-pay | Admitting: Family Medicine

## 2015-02-27 DIAGNOSIS — Z1231 Encounter for screening mammogram for malignant neoplasm of breast: Secondary | ICD-10-CM

## 2015-03-06 ENCOUNTER — Encounter: Payer: Self-pay | Admitting: Podiatry

## 2015-03-06 ENCOUNTER — Ambulatory Visit (INDEPENDENT_AMBULATORY_CARE_PROVIDER_SITE_OTHER): Payer: 59 | Admitting: Podiatry

## 2015-03-06 ENCOUNTER — Ambulatory Visit (INDEPENDENT_AMBULATORY_CARE_PROVIDER_SITE_OTHER): Payer: 59

## 2015-03-06 VITALS — BP 120/70 | HR 79 | Resp 16

## 2015-03-06 DIAGNOSIS — M2010 Hallux valgus (acquired), unspecified foot: Secondary | ICD-10-CM | POA: Diagnosis not present

## 2015-03-06 DIAGNOSIS — M205X9 Other deformities of toe(s) (acquired), unspecified foot: Secondary | ICD-10-CM | POA: Diagnosis not present

## 2015-03-06 NOTE — Progress Notes (Signed)
   Subjective:    Patient ID: Rachael King, female    DOB: 04/01/1976, 39 y.o.   MRN: [id number]  HPI Comments: "I have bunions"  Patient c/o aching 1st MPJ bilateral for several years. Getting worse. Swells and gets red. Shoes are uncomfortable. Tries to accommodate with shoe gear.  Foot Pain      Review of Systems  All other systems reviewed and are negative.      Objective:   Physical Exam: I have reviewed her past medical history medications allergy surgery social history and review of systems. Pulses are strongly palpable. Neurologic sensorium is intact per Semmes-Weinstein monofilament. The tendon reflexes are intact bilateral muscle strength +5 over 5 dorsiflexors plantar flexors and inverters and everters on physical musculature is intact. Orthopedic evaluation demonstrates all joints distal to the ankle range of motion without crepitation with exception of the first metatarsophalangeal joint bilaterally left greater than right. She has pain on dorsiflexion which is only about 10-15 of the left foot approximately 30 on the right foot. Radiographic evaluation demonstrates an elongated first metatarsal mildly elevated resulting in moderate to severe osteoarthritic changes of the first metatarsophalangeal joint left greater than right joint space narrowing subchondral sclerosis and eburnation is noted bilaterally left greater than right. Dorsal spurring left greater than right on lateral view.        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  Plan: We discussed the etiology pathology conservative versus surgical therapies today. She will follow-up with [place] in the spring for surgical consideration.

## 2015-03-11 ENCOUNTER — Ambulatory Visit: Payer: 59

## 2015-03-13 ENCOUNTER — Ambulatory Visit: Payer: 59

## 2015-07-01 ENCOUNTER — Encounter: Payer: Self-pay | Admitting: Physician Assistant

## 2015-07-01 MED ORDER — ETONOGESTREL-ETHINYL ESTRADIOL 0.12-0.015 MG/24HR VA RING: VAGINAL_RING | VAGINAL | Status: DC

## 2015-08-27 ENCOUNTER — Encounter: Payer: Self-pay | Admitting: Physician Assistant

## 2015-08-28 ENCOUNTER — Encounter: Payer: Self-pay | Admitting: *Deleted

## 2015-10-02 MED FILL — NUVARING VAGINAL RING: 0.12-0.015 | 84 days supply | Qty: 3 | Fill #2

## 2015-10-02 MED FILL — SUMATRIPTAN SUCC 100 MG TAB: 100 | 30 days supply | Qty: 9 | Fill #7

## 2015-10-03 ENCOUNTER — Telehealth: Payer: Self-pay | Admitting: *Deleted

## 2015-10-03 NOTE — Telephone Encounter (Signed)
Pt is currently using the Nuva Ring continuously, c/o breakthrough bleeding for the past 2 months where she is having 2 cycles a month.  Pt has appt on 10-07-15 with Santiago Glad to follow-up with migraines, will discuss options at that visit.

## 2015-10-07 ENCOUNTER — Encounter: Payer: Self-pay | Admitting: *Deleted

## 2015-10-07 ENCOUNTER — Encounter: Payer: Self-pay | Admitting: Physician Assistant

## 2015-10-07 ENCOUNTER — Ambulatory Visit (INDEPENDENT_AMBULATORY_CARE_PROVIDER_SITE_OTHER): Payer: 59 | Admitting: Physician Assistant

## 2015-10-07 VITALS — BP 131/81 | HR 83 | Resp 16 | Ht 65.0 in | Wt 152.0 lb

## 2015-10-07 DIAGNOSIS — G43009 Migraine without aura, not intractable, without status migrainosus: Secondary | ICD-10-CM | POA: Diagnosis not present

## 2015-10-07 DIAGNOSIS — G4489 Other headache syndrome: Secondary | ICD-10-CM | POA: Diagnosis not present

## 2015-10-07 DIAGNOSIS — N921 Excessive and frequent menstruation with irregular cycle: Secondary | ICD-10-CM

## 2015-10-07 DIAGNOSIS — Z975 Presence of (intrauterine) contraceptive device: Secondary | ICD-10-CM

## 2015-10-07 DIAGNOSIS — M62838 Other muscle spasm: Secondary | ICD-10-CM | POA: Diagnosis not present

## 2015-10-07 MED ORDER — SUMATRIPTAN SUCCINATE 100 MG PO TABS: 100.0000 mg | ORAL_TABLET | Freq: Once | ORAL | Status: DC | PRN

## 2015-10-07 MED ORDER — NAPROXEN SODIUM 550 MG PO TABS: 550.0000 mg | ORAL_TABLET | Freq: Two times a day (BID) | ORAL | Status: DC

## 2015-10-07 MED FILL — NAPROXEN SODIUM 550 MG TAB: 550 | 30 days supply | Qty: 60 | Fill #0

## 2015-10-07 NOTE — Patient Instructions (Signed)

## 2015-10-07 NOTE — Progress Notes (Signed)
Patient ID: Rachael King, female   DOB: Feb 04, 1976, 40 y.o.   MRN: [id number] History:  Rachael King is a 40 y.o. [id number] who presents to clinic today for headache follow up.  She notes her medication is working well and her headaches have not increased in frequency.  The Imitrex works well and completely, without the need for Anaprox.  She only uses Anaprox for smaller headaches that are not migranous.   She has had some instances of seeing a bright flashing light in the lower lateral quadrant of left eye.  She is uncertain if this has any relationship with headache.  It has occurred maybe 2x per month.  It does not occur the same day as a migraine.  Her eye doctor was unconcerned.  Also she notes her headaches have a hormonal component and for this reason, she uses NuvaRing continuously.   She has been having breakthrough bleeding with this - bleeding up to twice a month, sometimes for a week or more.    HIT6:43 Number of days in the last 4 weeks with:  Severe headache: 0 Moderate headache: 4 Mild headache: 0  No headache: 24   Past Medical History  Diagnosis Date  . No pertinent past medical history     Social History   Social History  . Marital Status: Married    Spouse Name: N/A  . Number of Children: N/A  . Years of Education: N/A   Occupational History  . Not on file.   Social History Main Topics  . Smoking status: Never Smoker   . Smokeless tobacco: Never Used  . Alcohol Use: 0.0 oz/week    0 Standard drinks or equivalent per week  . Drug Use: No  . Sexual Activity:    Partners: Male    Birth Control/ Protection: Inserts     Comment: Nuvaring   Other Topics Concern  . Not on file   Social History Narrative    Family History  Problem Relation Age of Onset  . Heart disease Father     A FIB  . Hypertension Mother     OPEN HEART SURGERY    No Known Allergies  Current Outpatient Prescriptions on File Prior to Visit  Medication Sig Dispense Refill  .  calcium carbonate (TUMS - DOSED IN MG ELEMENTAL CALCIUM) 500 MG chewable tablet Chew 2 tablets by mouth daily.    Marland Kitchen etonogestrel-ethinyl estradiol (NUVARING) 0.12-0.015 MG/24HR vaginal ring Insert vaginally and leave in place for 4 consecutive weeks, then remove and immediately replace with new ring. 1 each 12  . Multiple Vitamin (MULTIVITAMIN) tablet Take 1 tablet by mouth daily.    . naproxen sodium (ANAPROX DS) 550 MG tablet Take 1 tablet (550 mg total) by mouth 2 (two) times daily with a meal. 60 tablet 1  . SUMAtriptan (IMITREX) 100 MG tablet Take 1 tablet (100 mg total) by mouth once as needed for migraine. May repeat in 2 hours if headache persists or recurs. 9 tablet 11   No current facility-administered medications on file prior to visit.     Review of Systems:  All pertinent positive/negative included in HPI, all other review of systems are negative  Objective:  Physical Exam BP 131/81 mmHg  Pulse 83  Resp 16  Ht 5' 5"  (1.651 m)  Wt 152 lb (68.947 kg)  BMI 25.29 kg/m2  LMP 09/28/2015 CONSTITUTIONAL: Well-developed, well-nourished female in no acute distress.  EYES: EOM intact ENT: Normocephalic CARDIOVASCULAR: Regular rate and rhythm with  no adventitious sounds.  RESPIRATORY: Normal rate. Clear to auscultation bilaterally.  ENDOCRINE: Normal thyroid.  MUSCULOSKELETAL: Normal ROM, strength equal bilaterally, very tight trapezius muscles bilaterally SKIN: Warm, dry without erythema  NEUROLOGICAL: Alert, oriented, CN II-XII grossly intact, Appropriate balance PSYCH: Normal behavior, mood   Assessment & Plan:  Assessment: Migraine- improved Hormonal Headache - stable Muscle Spasm - new problem Breakthrough bleeding - new problem  Plan: Continue Imitrex for Migraine HA Continue Anaprox for more mild HA Continue NuvaRing.  However, remove after 3 weeks for the next 2-3 cycles.  Then, may go back to continuous use for steady hormonal state.  Muscle spasm - will trial  massage - will send letter of medical necessity  Follow-up in 5 months for annual exam, PRN.  Paticia Stack, PA-C 10/07/2015 8:36 AM

## 2015-10-21 ENCOUNTER — Encounter: Payer: Self-pay | Admitting: Podiatry

## 2015-10-21 ENCOUNTER — Ambulatory Visit (INDEPENDENT_AMBULATORY_CARE_PROVIDER_SITE_OTHER): Payer: 59 | Admitting: Podiatry

## 2015-10-21 VITALS — BP 126/73 | HR 80 | Resp 16

## 2015-10-21 DIAGNOSIS — M2012 Hallux valgus (acquired), left foot: Secondary | ICD-10-CM | POA: Diagnosis not present

## 2015-10-21 DIAGNOSIS — M205X9 Other deformities of toe(s) (acquired), unspecified foot: Secondary | ICD-10-CM

## 2015-10-21 NOTE — Progress Notes (Signed)
Rachael King presents today with her daughter Rachael King for surgical consult regarding her left foot. She states that it has become increasingly more painful an conservative therapy seems to not work very well at this point. She would like to consider surgical correction. She states that this is altering her ability to perform her daily activities take care of her children and exercise properly.   Objective: I have reviewed her past medical history medications allergies surgeries social history review of systems. Vital signs are stable she is alert and oriented 3. She presents in no acute distress. Pulses are strongly palpable bilateral. Neurologic sensorium is intact per Semmes-Weinstein monofilament. Deep tendon reflexes are intact. She has pain on range of motion of the first metatarsophalangeal joint left foot. There is crepitation. A large hypertrophic medial condyle to the head of the first metatarsal wrapping up upon the dorsal aspect of the metatarsal is present as well. I reviewed her previous radiographs which do demonstrate dorsal spurring was that narrowing subchondral sclerosis. It also demonstrates a very long second metatarsal which will more than likely result in capsulitis of the second metatarsophalangeal joint.   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 plantarflex elongated second metatarsal left foot.  Plan: discussed etiology pathology conservative versus surgical therapies. At this point we consented her for a surgical procedure consisting of an [facility] bunion repair with screw and second metatarsal osteotomy with screws. I answered all the questions regarding these procedures to the best of my ability in layman's terms. She understood this was amenable to it and signed all 3 pages of the consent form. We discussed the possible postop complications which may include but are not limited to postop pain bleeding swelling infection recurrence and need for  further surgery loss of digit possible limb loss  Life. We also discussed the possibility of the necessity of replacing the joint at the time of surgery with a Keller arthroplasty and a single silicone implant. She is okay with this provided it meets the requirements. I will follow-up with her in May for surgery. We dispensed a cam walker for her today. She will notify S with any changes in her past medical history current medical history or medications.

## 2015-10-21 NOTE — Patient Instructions (Signed)
Pre-Operative Instructions  Congratulations, you have decided to take an important step to improving your quality of life.  You can be assured that the doctors of [facility] will be with you every step of the way.  1. Plan to be at the surgery center/hospital at least 1 (one) hour prior to your scheduled time unless otherwise directed by the surgical center/hospital staff.  You must have a responsible adult accompany you, remain during the surgery and drive you home.  Make sure you have directions to the surgical center/hospital and know how to get there on time. 2. For hospital based surgery you will need to obtain a history and physical form from your family physician within 1 month prior to the date of surgery- we will give you a form for you primary physician.  3. We make every effort to accommodate the date you request for surgery.  There are however, times where surgery dates or times have to be moved.  We will contact you as soon as possible if a change in schedule is required.   4. No Aspirin/Ibuprofen for one week before surgery.  If you are on aspirin, any non-steroidal anti-inflammatory medications (Mobic, Aleve, Ibuprofen) you should stop taking it 7 days prior to your surgery.  You make take Tylenol  For pain prior to surgery.  5. Medications- If you are taking daily heart and blood pressure medications, seizure, reflux, allergy, asthma, anxiety, pain or diabetes medications, make sure the surgery center/hospital is aware before the day of surgery so they may notify you which medications to take or avoid the day of surgery. 6. No food or drink after midnight the night before surgery unless directed otherwise by surgical center/hospital staff. 7. No alcoholic beverages 24 hours prior to surgery.  No smoking 24 hours prior to or 24 hours after surgery. 8. Wear loose pants or shorts- loose enough to fit over bandages, boots, and casts. 9. No slip on shoes, sneakers are best. 10. Bring  your boot with you to the surgery center/hospital.  Also bring crutches or a walker if your physician has prescribed it for you.  If you do not have this equipment, it will be provided for you after surgery. 11. If you have not been contracted by the surgery center/hospital by the day before your surgery, call to confirm the date and time of your surgery. 12. Leave-time from work may vary depending on the type of surgery you have.  Appropriate arrangements should be made prior to surgery with your employer. 13. Prescriptions will be provided immediately following surgery by your doctor.  Have these filled as soon as possible after surgery and take the medication as directed. 14. Remove nail polish on the operative foot. 15. Wash the night before surgery.  The night before surgery wash the foot and leg well with the antibacterial soap provided and water paying special attention to beneath the toenails and in between the toes.  Rinse thoroughly with water and dry well with a towel.  Perform this wash unless told not to do so by your physician.  Enclosed: 1 Ice pack (please put in freezer the night before surgery)   1 Hibiclens skin cleaner   Pre-op Instructions  If you have any questions regarding the instructions, do not hesitate to call our office.  [place]: [address] [place], [place] [address] [phone]  [place]: [address] , [place] [address] [phone]  [place]: [address]  [place], [place] [address] [phone]  Dr. Richard   Tuchman DPM, Dr. Norman Regal DPM Dr. Richard Sikora DPM, Dr. M. Todd Talayah Picardi DPM, Dr. Kathryn Egerton DPM 

## 2015-11-12 ENCOUNTER — Telehealth: Payer: Self-pay | Admitting: *Deleted

## 2015-11-12 NOTE — Telephone Encounter (Signed)
"  I'm scheduled for surgery on May 12.  I was reading over my paperwork and it states that if I'm an employee of [employer] that I can get a cheaper cost if I have surgery at a [facility] facility.  So, I'd like to change surgery to [facility]."  [facility] will match the cost that [facility] offers.  "Okay great, thank you."

## 2015-11-17 MED FILL — SUMATRIPTAN SUCC 100 MG TAB: 100 | 30 days supply | Qty: 9 | Fill #0

## 2015-11-25 DIAGNOSIS — L570 Actinic keratosis: Secondary | ICD-10-CM | POA: Diagnosis not present

## 2015-11-25 DIAGNOSIS — L821 Other seborrheic keratosis: Secondary | ICD-10-CM | POA: Diagnosis not present

## 2015-11-25 DIAGNOSIS — D2261 Melanocytic nevi of right upper limb, including shoulder: Secondary | ICD-10-CM | POA: Diagnosis not present

## 2015-11-25 DIAGNOSIS — D2239 Melanocytic nevi of other parts of face: Secondary | ICD-10-CM | POA: Diagnosis not present

## 2015-11-25 DIAGNOSIS — D1801 Hemangioma of skin and subcutaneous tissue: Secondary | ICD-10-CM | POA: Diagnosis not present

## 2015-11-25 DIAGNOSIS — D2262 Melanocytic nevi of left upper limb, including shoulder: Secondary | ICD-10-CM | POA: Diagnosis not present

## 2015-11-25 DIAGNOSIS — D485 Neoplasm of uncertain behavior of skin: Secondary | ICD-10-CM | POA: Diagnosis not present

## 2015-11-25 DIAGNOSIS — D225 Melanocytic nevi of trunk: Secondary | ICD-10-CM | POA: Diagnosis not present

## 2015-12-15 MED FILL — SUMATRIPTAN SUCC 100 MG TAB: 100 | 30 days supply | Qty: 9 | Fill #1

## 2016-01-05 DIAGNOSIS — M2012 Hallux valgus (acquired), left foot: Secondary | ICD-10-CM

## 2016-01-12 ENCOUNTER — Telehealth: Payer: Self-pay

## 2016-01-12 NOTE — Telephone Encounter (Signed)
Time to schedule annual exam, called patient to let her know that May 2017 schedule was available, no answer, left message instructing her to return my call here at the office.

## 2016-01-13 MED FILL — NUVARING VAGINAL RING: 0.12-0.015 | 84 days supply | Qty: 3 | Fill #3

## 2016-01-13 MED FILL — SUMATRIPTAN SUCC 100 MG TAB: 100 | 30 days supply | Qty: 9 | Fill #2

## 2016-01-21 ENCOUNTER — Other Ambulatory Visit: Payer: Self-pay | Admitting: Podiatry

## 2016-01-21 MED ORDER — OXYCODONE-ACETAMINOPHEN 10-325 MG PO TABS: 1.0000 | ORAL_TABLET | Freq: Four times a day (QID) | ORAL | Status: DC | PRN

## 2016-01-21 MED ORDER — CEPHALEXIN 500 MG PO CAPS: 500.0000 mg | ORAL_CAPSULE | Freq: Three times a day (TID) | ORAL | Status: DC

## 2016-01-21 MED ORDER — PROMETHAZINE HCL 25 MG PO TABS: 25.0000 mg | ORAL_TABLET | Freq: Three times a day (TID) | ORAL | Status: DC | PRN

## 2016-01-22 ENCOUNTER — Encounter: Payer: Self-pay | Admitting: *Deleted

## 2016-01-22 ENCOUNTER — Telehealth: Payer: Self-pay | Admitting: *Deleted

## 2016-01-22 NOTE — Telephone Encounter (Signed)
"  I'm calling about my FMLA.  [employer] said they never received the information.  They said they just got the cover sheet.  Can I send the papers to you and you take care of it?"  The FMLA papers go to [facility].  I was just trying to call you.  I have bad news, Dr. Milinda Pointer is sick.  He will not be able to do your surgery tomorrow.  Can we reschedule it to Friday or Thursday of next week?  "That's fine, I'll do it next Friday.  I'll hear from the surgical center about the time correct?"  Yes the surgical center will call with the arrival time.  "I'll get those papers to [facility], thank you so much."  No, thank you.

## 2016-01-23 ENCOUNTER — Telehealth: Payer: Self-pay | Admitting: *Deleted

## 2016-01-23 NOTE — Telephone Encounter (Signed)
"  I'm working on patient's short term disability case.  I'm calling to verify that the patient had surgery today."  Patient didn't have surgery today.  We had to reschedule it to Jan 30, 2016.  Her doctor is out sick today.  "Okay, thank you.  I will make those changes."

## 2016-01-23 NOTE — Telephone Encounter (Signed)
Patient called.  Surgery was rescheduled to 01/30/2016.

## 2016-01-28 ENCOUNTER — Other Ambulatory Visit: Payer: Self-pay | Admitting: Podiatry

## 2016-01-29 ENCOUNTER — Encounter: Payer: Self-pay | Admitting: Podiatry

## 2016-01-30 ENCOUNTER — Encounter: Payer: Self-pay | Admitting: Podiatry

## 2016-01-30 DIAGNOSIS — M25572 Pain in left ankle and joints of left foot: Secondary | ICD-10-CM | POA: Diagnosis not present

## 2016-01-30 DIAGNOSIS — M216X2 Other acquired deformities of left foot: Secondary | ICD-10-CM | POA: Diagnosis not present

## 2016-01-30 DIAGNOSIS — M21542 Acquired clubfoot, left foot: Secondary | ICD-10-CM | POA: Diagnosis not present

## 2016-01-30 DIAGNOSIS — Z01818 Encounter for other preprocedural examination: Secondary | ICD-10-CM | POA: Diagnosis not present

## 2016-01-30 DIAGNOSIS — M79672 Pain in left foot: Secondary | ICD-10-CM | POA: Diagnosis not present

## 2016-01-30 DIAGNOSIS — M2012 Hallux valgus (acquired), left foot: Secondary | ICD-10-CM | POA: Diagnosis not present

## 2016-01-30 HISTORY — PX: BUNIONECTOMY: SHX129

## 2016-02-02 ENCOUNTER — Telehealth: Payer: Self-pay | Admitting: *Deleted

## 2016-02-02 NOTE — Telephone Encounter (Addendum)
Post op courtesy call-Left message instructing pt to follow the [place], don't walk or dangle surgery foot more than 61mns/hour, leave the surgical dressing in place clean and dry, leave the boot on at all times even to sleep and walk, take pain medications as instructed and call with concerns.  Pt called states she got my message, but the Surgical center said she didn't have to sleep in the boot, and her toe feels like it's beneath the other toe.  I told pt Iwas instructed to have pts sleep in the surgical boot, and if the ace was too tight loosen, and can gently tug the gauze but not remove it in the area that is tender, also may clip the colored stretchy band to allow for the area to expand.

## 2016-02-05 ENCOUNTER — Ambulatory Visit: Payer: 59 | Admitting: Obstetrics & Gynecology

## 2016-02-05 ENCOUNTER — Ambulatory Visit (INDEPENDENT_AMBULATORY_CARE_PROVIDER_SITE_OTHER): Payer: 59

## 2016-02-05 ENCOUNTER — Encounter: Payer: Self-pay | Admitting: Podiatry

## 2016-02-05 ENCOUNTER — Ambulatory Visit (INDEPENDENT_AMBULATORY_CARE_PROVIDER_SITE_OTHER): Payer: 59 | Admitting: Podiatry

## 2016-02-05 VITALS — BP 147/86 | HR 69 | Resp 12

## 2016-02-05 DIAGNOSIS — M205X9 Other deformities of toe(s) (acquired), unspecified foot: Secondary | ICD-10-CM

## 2016-02-05 NOTE — Progress Notes (Signed)
She presents today 1 week status post [place] bunionectomy with screw fixation left foot second metatarsal osteotomy with double screw fixation left foot. She states that she's doing just fine.  Objective: Vital signs are stable she is alert and oriented 3 pulses are palpable. Neurologic sensorium is intact was a dry sterile dressing was removed demonstrates minimal edema and no erythema cellulitis drainage or odor sutures are intact margins well coapted. Radiographs confirm well-healing surgical foot. First and second metatarsal osteotomies are intact.  Assessment: Well-healing surgical foot.  Plan: Redressed today dresser compressive dressing follow-up with me in 1 week for redress and suture removal.

## 2016-02-10 ENCOUNTER — Telehealth: Payer: Self-pay | Admitting: *Deleted

## 2016-02-10 NOTE — Telephone Encounter (Signed)
Talked to [facility] from Dr. Norberta Keens, [place] dental office regarding whether patient should take an antibiotic before her teeth cleaning procedure today. I told patient that Dr. Milinda Pointer was out today and I was not able to confirm with him whether this would be okay or not. Doroteo Bradford did state, "usually when patients have a procedure done, that when they work on the mouth, bacteria usually goes straight to the pin where surgery was done". I told her the patient did not have any allergies to any medications and as precaution I told her to administer the antibiotics today that the patient needed. Doroteo Bradford stated, "The patient would get 3-4 pills of antibiotic before her procedure today".

## 2016-02-16 ENCOUNTER — Ambulatory Visit (INDEPENDENT_AMBULATORY_CARE_PROVIDER_SITE_OTHER): Payer: 59 | Admitting: Obstetrics & Gynecology

## 2016-02-16 ENCOUNTER — Encounter: Payer: Self-pay | Admitting: Obstetrics & Gynecology

## 2016-02-16 VITALS — BP 134/84 | HR 106 | Ht 65.0 in | Wt 141.0 lb

## 2016-02-16 DIAGNOSIS — Z124 Encounter for screening for malignant neoplasm of cervix: Secondary | ICD-10-CM

## 2016-02-16 DIAGNOSIS — Z01419 Encounter for gynecological examination (general) (routine) without abnormal findings: Secondary | ICD-10-CM

## 2016-02-16 DIAGNOSIS — Z1239 Encounter for other screening for malignant neoplasm of breast: Secondary | ICD-10-CM

## 2016-02-16 DIAGNOSIS — Z1151 Encounter for screening for human papillomavirus (HPV): Secondary | ICD-10-CM | POA: Diagnosis not present

## 2016-02-16 NOTE — Patient Instructions (Signed)
Preventive Care for Adults, Female A healthy lifestyle and preventive care can promote health and wellness. Preventive health guidelines for women include the following key practices.  A routine yearly physical is a good way to check with your health care provider about your health and preventive screening. It is a chance to share any concerns and updates on your health and to receive a thorough exam.  Visit your dentist for a routine exam and preventive care every 6 months. Brush your teeth twice a day and floss once a day. Good oral hygiene prevents tooth decay and gum disease.  The frequency of eye exams is based on your age, health, family medical history, use of contact lenses, and other factors. Follow your health care provider's recommendations for frequency of eye exams.  Eat a healthy diet. Foods like vegetables, fruits, whole grains, low-fat dairy products, and lean protein foods contain the nutrients you need without too many calories. Decrease your intake of foods high in solid fats, added sugars, and salt. Eat the right amount of calories for you.Get information about a proper diet from your health care provider, if necessary.  Regular physical exercise is one of the most important things you can do for your health. Most adults should get at least 150 minutes of moderate-intensity exercise (any activity that increases your heart rate and causes you to sweat) each week. In addition, most adults need muscle-strengthening exercises on 2 or more days a week.  Maintain a healthy weight. The body mass index (BMI) is a screening tool to identify possible weight problems. It provides an estimate of body fat based on height and weight. Your health care provider can find your BMI and can help you achieve or maintain a healthy weight.For adults 20 years and older:  A BMI below 18.5 is considered underweight.  A BMI of 18.5 to 24.9 is normal.  A BMI of 25 to 29.9 is considered overweight.  A  BMI of 30 and above is considered obese.  Maintain normal blood lipids and cholesterol levels by exercising and minimizing your intake of saturated fat. Eat a balanced diet with plenty of fruit and vegetables. Blood tests for lipids and cholesterol should begin at age 45 and be repeated every 5 years. If your lipid or cholesterol levels are high, you are over 50, or you are at high risk for heart disease, you may need your cholesterol levels checked more frequently.Ongoing high lipid and cholesterol levels should be treated with medicines if diet and exercise are not working.  If you smoke, find out from your health care provider how to quit. If you do not use tobacco, do not start.  Lung cancer screening is recommended for adults aged 45-80 years who are at high risk for developing lung cancer because of a history of smoking. A yearly low-dose CT scan of the lungs is recommended for people who have at least a 30-pack-year history of smoking and are a current smoker or have quit within the past 15 years. A pack year of smoking is smoking an average of 1 pack of cigarettes a day for 1 year (for example: 1 pack a day for 30 years or 2 packs a day for 15 years). Yearly screening should continue until the smoker has stopped smoking for at least 15 years. Yearly screening should be stopped for people who develop a health problem that would prevent them from having lung cancer treatment.  If you are pregnant, do not drink alcohol. If you are  breastfeeding, be very cautious about drinking alcohol. If you are not pregnant and choose to drink alcohol, do not have more than 1 drink per day. One drink is considered to be 12 ounces (355 mL) of beer, 5 ounces (148 mL) of wine, or 1.5 ounces (44 mL) of liquor.  Avoid use of street drugs. Do not share needles with anyone. Ask for help if you need support or instructions about stopping the use of drugs.  High blood pressure causes heart disease and increases the risk  of stroke. Your blood pressure should be checked at least every 1 to 2 years. Ongoing high blood pressure should be treated with medicines if weight loss and exercise do not work.  If you are 55-79 years old, ask your health care provider if you should take aspirin to prevent strokes.  Diabetes screening is done by taking a blood sample to check your blood glucose level after you have not eaten for a certain period of time (fasting). If you are not overweight and you do not have risk factors for diabetes, you should be screened once every 3 years starting at age 45. If you are overweight or obese and you are 40-70 years of age, you should be screened for diabetes every year as part of your cardiovascular risk assessment.  Breast cancer screening is essential preventive care for women. You should practice "breast self-awareness." This means understanding the normal appearance and feel of your breasts and may include breast self-examination. Any changes detected, no matter how small, should be reported to a health care provider. Women in their 20s and 30s should have a clinical breast exam (CBE) by a health care provider as part of a regular health exam every 1 to 3 years. After age 40, women should have a CBE every year. Starting at age 40, women should consider having a mammogram (breast X-ray test) every year. Women who have a family history of breast cancer should talk to their health care provider about genetic screening. Women at a high risk of breast cancer should talk to their health care providers about having an MRI and a mammogram every year.  Breast cancer gene (BRCA)-related cancer risk assessment is recommended for women who have family members with BRCA-related cancers. BRCA-related cancers include breast, ovarian, tubal, and peritoneal cancers. Having family members with these cancers may be associated with an increased risk for harmful changes (mutations) in the breast cancer genes BRCA1 and  BRCA2. Results of the assessment will determine the need for genetic counseling and BRCA1 and BRCA2 testing.  Your health care provider may recommend that you be screened regularly for cancer of the pelvic organs (ovaries, uterus, and vagina). This screening involves a pelvic examination, including checking for microscopic changes to the surface of your cervix (Pap test). You may be encouraged to have this screening done every 3 years, beginning at age 21.  For women ages 30-65, health care providers may recommend pelvic exams and Pap testing every 3 years, or they may recommend the Pap and pelvic exam, combined with testing for human papilloma virus (HPV), every 5 years. Some types of HPV increase your risk of cervical cancer. Testing for HPV may also be done on women of any age with unclear Pap test results.  Other health care providers may not recommend any screening for nonpregnant women who are considered low risk for pelvic cancer and who do not have symptoms. Ask your health care provider if a screening pelvic exam is right for   you.  If you have had past treatment for cervical cancer or a condition that could lead to cancer, you need Pap tests and screening for cancer for at least 20 years after your treatment. If Pap tests have been discontinued, your risk factors (such as having a new sexual partner) need to be reassessed to determine if screening should resume. Some women have medical problems that increase the chance of getting cervical cancer. In these cases, your health care provider may recommend more frequent screening and Pap tests.  Colorectal cancer can be detected and often prevented. Most routine colorectal cancer screening begins at the age of 50 years and continues through age 75 years. However, your health care provider may recommend screening at an earlier age if you have risk factors for colon cancer. On a yearly basis, your health care provider may provide home test kits to check  for hidden blood in the stool. Use of a small camera at the end of a tube, to directly examine the colon (sigmoidoscopy or colonoscopy), can detect the earliest forms of colorectal cancer. Talk to your health care provider about this at age 50, when routine screening begins. Direct exam of the colon should be repeated every 5-10 years through age 75 years, unless early forms of precancerous polyps or small growths are found.  People who are at an increased risk for hepatitis B should be screened for this virus. You are considered at high risk for hepatitis B if:  You were born in a country where hepatitis B occurs often. Talk with your health care provider about which countries are considered high risk.  Your parents were born in a high-risk country and you have not received a shot to protect against hepatitis B (hepatitis B vaccine).  You have HIV or AIDS.  You use needles to inject street drugs.  You live with, or have sex with, someone who has hepatitis B.  You get hemodialysis treatment.  You take certain medicines for conditions like cancer, organ transplantation, and autoimmune conditions.  Hepatitis C blood testing is recommended for all people born from 1945 through 1965 and any individual with known risks for hepatitis C.  Practice safe sex. Use condoms and avoid high-risk sexual practices to reduce the spread of sexually transmitted infections (STIs). STIs include gonorrhea, chlamydia, syphilis, trichomonas, herpes, HPV, and human immunodeficiency virus (HIV). Herpes, HIV, and HPV are viral illnesses that have no cure. They can result in disability, cancer, and death.  You should be screened for sexually transmitted illnesses (STIs) including gonorrhea and chlamydia if:  You are sexually active and are younger than 24 years.  You are older than 24 years and your health care provider tells you that you are at risk for this type of infection.  Your sexual activity has changed  since you were last screened and you are at an increased risk for chlamydia or gonorrhea. Ask your health care provider if you are at risk.  If you are at risk of being infected with HIV, it is recommended that you take a prescription medicine daily to prevent HIV infection. This is called preexposure prophylaxis (PrEP). You are considered at risk if:  You are sexually active and do not regularly use condoms or know the HIV status of your partner(s).  You take drugs by injection.  You are sexually active with a partner who has HIV.  Talk with your health care provider about whether you are at high risk of being infected with HIV. If   you choose to begin PrEP, you should first be tested for HIV. You should then be tested every 3 months for as long as you are taking PrEP.  Osteoporosis is a disease in which the bones lose minerals and strength with aging. This can result in serious bone fractures or breaks. The risk of osteoporosis can be identified using a bone density scan. Women ages 16 years and over and women at risk for fractures or osteoporosis should discuss screening with their health care providers. Ask your health care provider whether you should take a calcium supplement or vitamin D to reduce the rate of osteoporosis.  Menopause can be associated with physical symptoms and risks. Hormone replacement therapy is available to decrease symptoms and risks. You should talk to your health care provider about whether hormone replacement therapy is right for you.  Use sunscreen. Apply sunscreen liberally and repeatedly throughout the day. You should seek shade when your shadow is shorter than you. Protect yourself by wearing long sleeves, pants, a wide-brimmed hat, and sunglasses year round, whenever you are outdoors.  Once a month, do a whole body skin exam, using a mirror to look at the skin on your back. Tell your health care provider of new moles, moles that have irregular borders, moles that  are larger than a pencil eraser, or moles that have changed in shape or color.  Stay current with required vaccines (immunizations).  Influenza vaccine. All adults should be immunized every year.  Tetanus, diphtheria, and acellular pertussis (Td, Tdap) vaccine. Pregnant women should receive 1 dose of Tdap vaccine during each pregnancy. The dose should be obtained regardless of the length of time since the last dose. Immunization is preferred during the 27th-36th week of gestation. An adult who has not previously received Tdap or who does not know her vaccine status should receive 1 dose of Tdap. This initial dose should be followed by tetanus and diphtheria toxoids (Td) booster doses every 10 years. Adults with an unknown or incomplete history of completing a 3-dose immunization series with Td-containing vaccines should begin or complete a primary immunization series including a Tdap dose. Adults should receive a Td booster every 10 years.  Varicella vaccine. An adult without evidence of immunity to varicella should receive 2 doses or a second dose if she has previously received 1 dose. Pregnant females who do not have evidence of immunity should receive the first dose after pregnancy. This first dose should be obtained before leaving the health care facility. The second dose should be obtained 4-8 weeks after the first dose.  Human papillomavirus (HPV) vaccine. Females aged 13-26 years who have not received the vaccine previously should obtain the 3-dose series. The vaccine is not recommended for use in pregnant females. However, pregnancy testing is not needed before receiving a dose. If a female is found to be pregnant after receiving a dose, no treatment is needed. In that case, the remaining doses should be delayed until after the pregnancy. Immunization is recommended for any person with an immunocompromised condition through the age of 69 years if she did not get any or all doses earlier. During the  3-dose series, the second dose should be obtained 4-8 weeks after the first dose. The third dose should be obtained 24 weeks after the first dose and 16 weeks after the second dose.  Zoster vaccine. One dose is recommended for adults aged 65 years or older unless certain conditions are present.  Measles, mumps, and rubella (MMR) vaccine. Adults born  before 1957 generally are considered immune to measles and mumps. Adults born in 1957 or later should have 1 or more doses of MMR vaccine unless there is a contraindication to the vaccine or there is laboratory evidence of immunity to each of the three diseases. A routine second dose of MMR vaccine should be obtained at least 28 days after the first dose for students attending postsecondary schools, health care workers, or international travelers. People who received inactivated measles vaccine or an unknown type of measles vaccine during 1963-1967 should receive 2 doses of MMR vaccine. People who received inactivated mumps vaccine or an unknown type of mumps vaccine before 1979 and are at high risk for mumps infection should consider immunization with 2 doses of MMR vaccine. For females of childbearing age, rubella immunity should be determined. If there is no evidence of immunity, females who are not pregnant should be vaccinated. If there is no evidence of immunity, females who are pregnant should delay immunization until after pregnancy. Unvaccinated health care workers born before 1957 who lack laboratory evidence of measles, mumps, or rubella immunity or laboratory confirmation of disease should consider measles and mumps immunization with 2 doses of MMR vaccine or rubella immunization with 1 dose of MMR vaccine.  Pneumococcal 13-valent conjugate (PCV13) vaccine. When indicated, a person who is uncertain of his immunization history and has no record of immunization should receive the PCV13 vaccine. All adults 65 years of age and older should receive this  vaccine. An adult aged 19 years or older who has certain medical conditions and has not been previously immunized should receive 1 dose of PCV13 vaccine. This PCV13 should be followed with a dose of pneumococcal polysaccharide (PPSV23) vaccine. Adults who are at high risk for pneumococcal disease should obtain the PPSV23 vaccine at least 8 weeks after the dose of PCV13 vaccine. Adults older than 40 years of age who have normal immune system function should obtain the PPSV23 vaccine dose at least 1 year after the dose of PCV13 vaccine.  Pneumococcal polysaccharide (PPSV23) vaccine. When PCV13 is also indicated, PCV13 should be obtained first. All adults aged 65 years and older should be immunized. An adult younger than age 65 years who has certain medical conditions should be immunized. Any person who resides in a nursing home or long-term care facility should be immunized. An adult smoker should be immunized. People with an immunocompromised condition and certain other conditions should receive both PCV13 and PPSV23 vaccines. People with human immunodeficiency virus (HIV) infection should be immunized as soon as possible after diagnosis. Immunization during chemotherapy or radiation therapy should be avoided. Routine use of PPSV23 vaccine is not recommended for American Indians, [place] Natives, or people younger than 65 years unless there are medical conditions that require PPSV23 vaccine. When indicated, people who have unknown immunization and have no record of immunization should receive PPSV23 vaccine. One-time revaccination 5 years after the first dose of PPSV23 is recommended for people aged 19-64 years who have chronic kidney failure, nephrotic syndrome, asplenia, or immunocompromised conditions. People who received 1-2 doses of PPSV23 before age 65 years should receive another dose of PPSV23 vaccine at age 65 years or later if at least 5 years have passed since the previous dose. Doses of PPSV23 are not  needed for people immunized with PPSV23 at or after age 65 years.  Meningococcal vaccine. Adults with asplenia or persistent complement component deficiencies should receive 2 doses of quadrivalent meningococcal conjugate (MenACWY-D) vaccine. The doses should be obtained   at least 2 months apart. Microbiologists working with certain meningococcal bacteria, [place] recruits, people at risk during an outbreak, and people who travel to or live in countries with a high rate of meningitis should be immunized. A first-year college student up through age 34 years who is living in a residence hall should receive a dose if she did not receive a dose on or after her 16th birthday. Adults who have certain high-risk conditions should receive one or more doses of vaccine.  Hepatitis A vaccine. Adults who wish to be protected from this disease, have certain high-risk conditions, work with hepatitis A-infected animals, work in hepatitis A research labs, or travel to or work in countries with a high rate of hepatitis A should be immunized. Adults who were previously unvaccinated and who anticipate close contact with an international adoptee during the first 60 days after arrival in the [place] from a country with a high rate of hepatitis A should be immunized.  Hepatitis B vaccine. Adults who wish to be protected from this disease, have certain high-risk conditions, may be exposed to blood or other infectious body fluids, are household contacts or sex partners of hepatitis B positive people, are clients or workers in certain care facilities, or travel to or work in countries with a high rate of hepatitis B should be immunized.  Haemophilus influenzae type b (Hib) vaccine. A previously unvaccinated person with asplenia or sickle cell disease or having a scheduled splenectomy should receive 1 dose of Hib vaccine. Regardless of previous immunization, a recipient of a hematopoietic stem cell transplant should receive a  3-dose series 6-12 months after her successful transplant. Hib vaccine is not recommended for adults with HIV infection. Preventive Services / Frequency Ages 35 to 4 years  Blood pressure check.** / Every 3-5 years.  Lipid and cholesterol check.** / Every 5 years beginning at age 60.  Clinical breast exam.** / Every 3 years for women in their 71s and 10s.  BRCA-related cancer risk assessment.** / For women who have family members with a BRCA-related cancer (breast, ovarian, tubal, or peritoneal cancers).  Pap test.** / Every 2 years from ages 76 through 26. Every 3 years starting at age 61 through age 76 or 93 with a history of 3 consecutive normal Pap tests.  HPV screening.** / Every 3 years from ages 37 through ages 60 to 51 with a history of 3 consecutive normal Pap tests.  Hepatitis C blood test.** / For any individual with known risks for hepatitis C.  Skin self-exam. / Monthly.  Influenza vaccine. / Every year.  Tetanus, diphtheria, and acellular pertussis (Tdap, Td) vaccine.** / Consult your health care provider. Pregnant women should receive 1 dose of Tdap vaccine during each pregnancy. 1 dose of Td every 10 years.  Varicella vaccine.** / Consult your health care provider. Pregnant females who do not have evidence of immunity should receive the first dose after pregnancy.  HPV vaccine. / 3 doses over 6 months, if 93 and younger. The vaccine is not recommended for use in pregnant females. However, pregnancy testing is not needed before receiving a dose.  Measles, mumps, rubella (MMR) vaccine.** / You need at least 1 dose of MMR if you were born in 1957 or later. You may also need a 2nd dose. For females of childbearing age, rubella immunity should be determined. If there is no evidence of immunity, females who are not pregnant should be vaccinated. If there is no evidence of immunity, females who are  pregnant should delay immunization until after pregnancy.  Pneumococcal  13-valent conjugate (PCV13) vaccine.** / Consult your health care provider.  Pneumococcal polysaccharide (PPSV23) vaccine.** / 1 to 2 doses if you smoke cigarettes or if you have certain conditions.  Meningococcal vaccine.** / 1 dose if you are age 68 to 8 years and a Market researcher living in a residence hall, or have one of several medical conditions, you need to get vaccinated against meningococcal disease. You may also need additional booster doses.  Hepatitis A vaccine.** / Consult your health care provider.  Hepatitis B vaccine.** / Consult your health care provider.  Haemophilus influenzae type b (Hib) vaccine.** / Consult your health care provider. Ages 7 to 53 years  Blood pressure check.** / Every year.  Lipid and cholesterol check.** / Every 5 years beginning at age 25 years.  Lung cancer screening. / Every year if you are aged 11-80 years and have a 30-pack-year history of smoking and currently smoke or have quit within the past 15 years. Yearly screening is stopped once you have quit smoking for at least 15 years or develop a health problem that would prevent you from having lung cancer treatment.  Clinical breast exam.** / Every year after age 48 years.  BRCA-related cancer risk assessment.** / For women who have family members with a BRCA-related cancer (breast, ovarian, tubal, or peritoneal cancers).  Mammogram.** / Every year beginning at age 41 years and continuing for as long as you are in good health. Consult with your health care provider.  Pap test.** / Every 3 years starting at age 65 years through age 37 or 70 years with a history of 3 consecutive normal Pap tests.  HPV screening.** / Every 3 years from ages 72 years through ages 60 to 40 years with a history of 3 consecutive normal Pap tests.  Fecal occult blood test (FOBT) of stool. / Every year beginning at age 21 years and continuing until age 5 years. You may not need to do this test if you get  a colonoscopy every 10 years.  Flexible sigmoidoscopy or colonoscopy.** / Every 5 years for a flexible sigmoidoscopy or every 10 years for a colonoscopy beginning at age 35 years and continuing until age 48 years.  Hepatitis C blood test.** / For all people born from [birth year] through 1965 and any individual with known risks for hepatitis C.  Skin self-exam. / Monthly.  Influenza vaccine. / Every year.  Tetanus, diphtheria, and acellular pertussis (Tdap/Td) vaccine.** / Consult your health care provider. Pregnant women should receive 1 dose of Tdap vaccine during each pregnancy. 1 dose of Td every 10 years.  Varicella vaccine.** / Consult your health care provider. Pregnant females who do not have evidence of immunity should receive the first dose after pregnancy.  Zoster vaccine.** / 1 dose for adults aged 30 years or older.  Measles, mumps, rubella (MMR) vaccine.** / You need at least 1 dose of MMR if you were born in 1957 or later. You may also need a second dose. For females of childbearing age, rubella immunity should be determined. If there is no evidence of immunity, females who are not pregnant should be vaccinated. If there is no evidence of immunity, females who are pregnant should delay immunization until after pregnancy.  Pneumococcal 13-valent conjugate (PCV13) vaccine.** / Consult your health care provider.  Pneumococcal polysaccharide (PPSV23) vaccine.** / 1 to 2 doses if you smoke cigarettes or if you have certain conditions.  Meningococcal vaccine.** /  Consult your health care provider.  Hepatitis A vaccine.** / Consult your health care provider.  Hepatitis B vaccine.** / Consult your health care provider.  Haemophilus influenzae type b (Hib) vaccine.** / Consult your health care provider. Ages 40 years and over  Blood pressure check.** / Every year.  Lipid and cholesterol check.** / Every 5 years beginning at age 32 years.  Lung cancer screening. / Every year if you  are aged 49-80 years and have a 30-pack-year history of smoking and currently smoke or have quit within the past 15 years. Yearly screening is stopped once you have quit smoking for at least 15 years or develop a health problem that would prevent you from having lung cancer treatment.  Clinical breast exam.** / Every year after age 31 years.  BRCA-related cancer risk assessment.** / For women who have family members with a BRCA-related cancer (breast, ovarian, tubal, or peritoneal cancers).  Mammogram.** / Every year beginning at age 48 years and continuing for as long as you are in good health. Consult with your health care provider.  Pap test.** / Every 3 years starting at age 57 years through age 29 or 50 years with 3 consecutive normal Pap tests. Testing can be stopped between 65 and 70 years with 3 consecutive normal Pap tests and no abnormal Pap or HPV tests in the past 10 years.  HPV screening.** / Every 3 years from ages 26 years through ages 26 or 68 years with a history of 3 consecutive normal Pap tests. Testing can be stopped between 65 and 70 years with 3 consecutive normal Pap tests and no abnormal Pap or HPV tests in the past 10 years.  Fecal occult blood test (FOBT) of stool. / Every year beginning at age 52 years and continuing until age 79 years. You may not need to do this test if you get a colonoscopy every 10 years.  Flexible sigmoidoscopy or colonoscopy.** / Every 5 years for a flexible sigmoidoscopy or every 10 years for a colonoscopy beginning at age 66 years and continuing until age 8 years.  Hepatitis C blood test.** / For all people born from [birth year] through 1965 and any individual with known risks for hepatitis C.  Osteoporosis screening.** / A one-time screening for women ages 25 years and over and women at risk for fractures or osteoporosis.  Skin self-exam. / Monthly.  Influenza vaccine. / Every year.  Tetanus, diphtheria, and acellular pertussis (Tdap/Td)  vaccine.** / 1 dose of Td every 10 years.  Varicella vaccine.** / Consult your health care provider.  Zoster vaccine.** / 1 dose for adults aged 47 years or older.  Pneumococcal 13-valent conjugate (PCV13) vaccine.** / Consult your health care provider.  Pneumococcal polysaccharide (PPSV23) vaccine.** / 1 dose for all adults aged 72 years and older.  Meningococcal vaccine.** / Consult your health care provider.  Hepatitis A vaccine.** / Consult your health care provider.  Hepatitis B vaccine.** / Consult your health care provider.  Haemophilus influenzae type b (Hib) vaccine.** / Consult your health care provider. ** Family history and personal history of risk and conditions may change your health care provider's recommendations.   This information is not intended to replace advice given to you by your health care provider. Make sure you discuss any questions you have with your health care provider.   Document Released: 10/26/2001 Document Revised: 09/20/2014 Document Reviewed: 01/25/2011 Elsevier Interactive Patient Education [employer].

## 2016-02-16 NOTE — Progress Notes (Signed)
GYNECOLOGY CLINIC ANNUAL PREVENTATIVE CARE ENCOUNTER NOTE  Subjective:   Rachael King is a 40 y.o. G36P2002 female here for a routine annual gynecologic exam.  Current complaints: none.   Denies abnormal vaginal bleeding, discharge, pelvic pain, problems with intercourse or other gynecologic concerns.    Gynecologic History Patient's last menstrual period was 01/16/2016. Contraception: NuvaRing vaginal inserts Last Pap: 02/04/15. Results were: normal  Obstetric History OB History  Gravida Para Term Preterm AB SAB TAB Ectopic Multiple Living  2 2 2       2     # Outcome Date GA Lbr Len/2nd Weight Sex Delivery Anes PTL Lv  2 Term 09/23/11 [id number] / 00:29 7 lb 3.3 oz (3.27 kg) F Vag-Spont EPI  Y  1 Term     F Vag-Spont EPI N Y      Past Medical History  Diagnosis Date  . No pertinent past medical history     Past Surgical History  Procedure Laterality Date  . Wisdom tooth extraction    . Mandible reconstruction    . Bunionectomy Left 01/30/2016    [place] bunionectomy with screw fixation left foot second metatarsal osteotomy with double screw fixation left foot.    Current Outpatient Prescriptions on File Prior to Visit  Medication Sig Dispense Refill  . calcium carbonate (TUMS - DOSED IN MG ELEMENTAL CALCIUM) 500 MG chewable tablet Chew 2 tablets by mouth daily.    .Marland Kitchenetonogestrel-ethinyl estradiol (NUVARING) 0.12-0.015 MG/24HR vaginal ring Insert vaginally and leave in place for 4 consecutive weeks, then remove and immediately replace with new ring. 1 each 12  . Multiple Vitamin (MULTIVITAMIN) tablet Take 1 tablet by mouth daily.    . SUMAtriptan (IMITREX) 100 MG tablet Take 1 tablet (100 mg total) by mouth once as needed for migraine. May repeat in 2 hours if headache persists or recurs. 9 tablet 11  . cephALEXin (KEFLEX) 500 MG capsule Take 1 capsule (500 mg total) by mouth 3 (three) times daily. 30 capsule 0  . naproxen sodium (ANAPROX DS) 550 MG tablet Take 1  tablet (550 mg total) by mouth 2 (two) times daily with a meal. 60 tablet 1  . oxyCODONE-acetaminophen (PERCOCET) 10-325 MG tablet Take 1 tablet by mouth every 6 (six) hours as needed for pain. 40 tablet 0  . promethazine (PHENERGAN) 25 MG tablet Take 1 tablet (25 mg total) by mouth every 8 (eight) hours as needed for nausea or vomiting. 20 tablet 0   No current facility-administered medications on file prior to visit.    No Known Allergies  Social History   Social History  . Marital Status: Married    Spouse Name: N/A  . Number of Children: N/A  . Years of Education: N/A   Occupational History  . Not on file.   Social History Main Topics  . Smoking status: Never Smoker   . Smokeless tobacco: Never Used  . Alcohol Use: 0.0 oz/week    0 Standard drinks or equivalent per week     Comment: social  . Drug Use: No  . Sexual Activity:    Partners: Male    Birth Control/ Protection: Inserts     Comment: Nuvaring   Other Topics Concern  . Not on file   Social History Narrative    Family History  Problem Relation Age of Onset  . Heart disease Father     A FIB  . Hypertension Mother     OPEN HEART SURGERY  The following portions of the patient's history were reviewed and updated as appropriate: allergies, current medications, past family history, past medical history, past social history, past surgical history and problem list.  Review of Systems Pertinent items noted in HPI and remainder of comprehensive ROS otherwise negative.   Objective:  BP 134/84 mmHg  Pulse 106  Ht 5' 5"  (1.651 m)  Wt 141 lb (63.957 kg)  BMI 23.46 kg/m2  LMP 01/16/2016 CONSTITUTIONAL: Well-developed, well-nourished female in no acute distress.  HENT:  Normocephalic, atraumatic, External right and left ear normal. Oropharynx is clear and moist EYES: Conjunctivae and EOM are normal. Pupils are equal, round, and reactive to light. No scleral icterus.  NECK: Normal range of motion, supple, no  masses.  Normal thyroid.  SKIN: Skin is warm and dry. No rash noted. Not diaphoretic. No erythema. No pallor. NEUROLOGIC: Alert and oriented to person, place, and time. Normal reflexes, muscle tone coordination. No cranial nerve deficit noted. PSYCHIATRIC: Normal mood and affect. Normal behavior. Normal judgment and thought content. CARDIOVASCULAR: Normal heart rate noted, regular rhythm RESPIRATORY: Clear to auscultation bilaterally. Effort and breath sounds normal, no problems with respiration noted. BREASTS: Symmetric in size. No masses, skin changes, nipple drainage, or lymphadenopathy. ABDOMEN: Soft, normal bowel sounds, no distention noted.  No tenderness, rebound or guarding.  PELVIC: Normal appearing external genitalia; normal appearing vaginal mucosa and cervix.  No abnormal discharge noted.  Pap smear obtained.  Normal uterine size, no other palpable masses, no uterine or adnexal tenderness. MUSCULOSKELETAL: Normal range of motion. No tenderness.  No cyanosis, clubbing, or edema.  2+ distal pulses.  Orthopedic boot present on left leg after recent planned foot surgery.   Assessment:  Annual gynecologic examination with pap smear   Plan:  Will follow up results of pap smear and manage accordingly. Mammogram scheduled Routine preventative health maintenance measures emphasized. Please refer to After Visit Summary for other counseling recommendations.    Verita Schneiders, MD, [place] Attending Obstetrician & Gynecologist, [place] [facility]

## 2016-02-17 ENCOUNTER — Ambulatory Visit (INDEPENDENT_AMBULATORY_CARE_PROVIDER_SITE_OTHER): Payer: 59 | Admitting: Podiatry

## 2016-02-17 ENCOUNTER — Encounter: Payer: Self-pay | Admitting: Podiatry

## 2016-02-17 VITALS — BP 125/76 | HR 91 | Resp 16

## 2016-02-17 DIAGNOSIS — Z9889 Other specified postprocedural states: Secondary | ICD-10-CM

## 2016-02-17 DIAGNOSIS — M2012 Hallux valgus (acquired), left foot: Secondary | ICD-10-CM

## 2016-02-17 LAB — CYTOLOGY - PAP

## 2016-02-17 NOTE — Progress Notes (Signed)
She presents today 2 weeks status post [place] bunion repair second metatarsal osteotomy left foot. She denies fever chills nausea vomiting muscle aches and pains. States that she is doing very well. She started dragging that the boot around. Denies chest pain and shortness of breath.  Objective: Vital signs are stable alert and oriented 3. Pulses are palpable. Neurologic sensorium is intact. Deep tendon reflexes are intact. She has great range of motion of the first and second metatarsophalangeal joints. Her third toe is sitting rectus for some reason. Extensor tendons appear to be free from any adhesions or sutures. All of her sutures were removed today margins remain well coapted.  Assessment: Surgical foot left.  Plan: Discontinue the cam walker start utilizing a compression anklet and are per shoe I will allow her to go back to work Monday as long she can keep the foot elevated.  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

## 2016-02-19 MED FILL — SUMATRIPTAN SUCC 100 MG TAB: 100 | 30 days supply | Qty: 9 | Fill #3

## 2016-02-20 ENCOUNTER — Ambulatory Visit (HOSPITAL_COMMUNITY)
Admission: RE | Admit: 2016-02-20 | Discharge: 2016-02-20 | Disposition: A | Discharge status: Home / Self Care | Payer: 59 | Source: Ambulatory Visit | Attending: Obstetrics & Gynecology | Admitting: Obstetrics & Gynecology

## 2016-02-20 DIAGNOSIS — Z1239 Encounter for other screening for malignant neoplasm of breast: Secondary | ICD-10-CM | POA: Diagnosis not present

## 2016-02-20 DIAGNOSIS — Z1231 Encounter for screening mammogram for malignant neoplasm of breast: Secondary | ICD-10-CM | POA: Insufficient documentation

## 2016-02-25 DIAGNOSIS — Z0000 Encounter for general adult medical examination without abnormal findings: Secondary | ICD-10-CM | POA: Diagnosis not present

## 2016-02-28 DIAGNOSIS — I493 Ventricular premature depolarization: Secondary | ICD-10-CM | POA: Insufficient documentation

## 2016-03-01 DIAGNOSIS — Z0000 Encounter for general adult medical examination without abnormal findings: Secondary | ICD-10-CM | POA: Diagnosis not present

## 2016-03-04 ENCOUNTER — Ambulatory Visit (INDEPENDENT_AMBULATORY_CARE_PROVIDER_SITE_OTHER): Payer: 59

## 2016-03-04 ENCOUNTER — Ambulatory Visit (INDEPENDENT_AMBULATORY_CARE_PROVIDER_SITE_OTHER): Payer: 59 | Admitting: Podiatry

## 2016-03-04 ENCOUNTER — Encounter: Payer: Self-pay | Admitting: Podiatry

## 2016-03-04 VITALS — BP 113/64 | HR 93 | Resp 16

## 2016-03-04 DIAGNOSIS — Z9889 Other specified postprocedural states: Secondary | ICD-10-CM

## 2016-03-04 DIAGNOSIS — M2012 Hallux valgus (acquired), left foot: Secondary | ICD-10-CM | POA: Diagnosis not present
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
## 2016-03-25 MED FILL — SUMATRIPTAN SUCC 100 MG TAB: 100 | 30 days supply | Qty: 9 | Fill #4

## 2016-04-01 ENCOUNTER — Encounter: Payer: Self-pay | Admitting: Podiatry

## 2016-04-01 ENCOUNTER — Ambulatory Visit (INDEPENDENT_AMBULATORY_CARE_PROVIDER_SITE_OTHER): Payer: 59 | Admitting: Podiatry

## 2016-04-01 ENCOUNTER — Ambulatory Visit (INDEPENDENT_AMBULATORY_CARE_PROVIDER_SITE_OTHER): Payer: 59

## 2016-04-01 DIAGNOSIS — Z9889 Other specified postprocedural states: Secondary | ICD-10-CM

## 2016-04-01 DIAGNOSIS — M2012 Hallux valgus (acquired), left foot: Secondary | ICD-10-CM | POA: Diagnosis not present

## 2016-04-03 NOTE — Progress Notes (Signed)
She presents today 2 months status post [facility] bunion repair left foot. States it seems to be doing pretty good.  Objective: Vital signs are stable she is alert and oriented 3. No pain on palpation. Good range of motion of the first metatarsophalangeal joint there is somewhat tender. Radiographs confirm well-healing osteotomies.  Assessment: Well-healing surgical foot.  Plan: Follow up with me in 4 weeks may try to get back to her regular activities.

## 2016-04-12 ENCOUNTER — Encounter: Payer: Self-pay | Admitting: Physician Assistant

## 2016-04-12 DIAGNOSIS — Z3049 Encounter for surveillance of other contraceptives: Secondary | ICD-10-CM

## 2016-04-13 MED ORDER — ETONOGESTREL-ETHINYL ESTRADIOL 0.12-0.015 MG/24HR VA RING: VAGINAL_RING | VAGINAL | 12 refills | Status: DC

## 2016-04-13 MED FILL — NUVARING VAGINAL RING: 0.12-0.015 | 84 days supply | Qty: 3 | Fill #0

## 2016-04-13 NOTE — Telephone Encounter (Signed)
Pt requesting refill on Nuva Ring, last office visit 02-16-16, sent refill to pharmacy per Dr Harolyn Rutherford.

## 2016-04-14 MED FILL — SUMATRIPTAN SUCC 100 MG TAB: 100 | 30 days supply | Qty: 9 | Fill #5

## 2016-05-20 NOTE — Progress Notes (Signed)
DOS 05.19.2017 [place] Osteotomy with screw 1st metatarsal left foot, 2nd metatarsal osteotomy with screw left foot

## 2016-05-21 MED FILL — SUMATRIPTAN SUCC 100 MG TAB: 100 | 30 days supply | Qty: 9 | Fill #6

## 2016-06-18 MED FILL — NUVARING VAGINAL RING: 0.12-0.015 | 84 days supply | Qty: 3 | Fill #1

## 2016-07-16 DIAGNOSIS — H52223 Regular astigmatism, bilateral: Secondary | ICD-10-CM | POA: Diagnosis not present

## 2016-07-16 DIAGNOSIS — H5213 Myopia, bilateral: Secondary | ICD-10-CM | POA: Diagnosis not present

## 2016-07-16 DIAGNOSIS — H11153 Pinguecula, bilateral: Secondary | ICD-10-CM | POA: Diagnosis not present

## 2016-07-26 MED FILL — SUMATRIPTAN SUCC 100 MG TAB: 100 | 30 days supply | Qty: 9 | Fill #7

## 2016-08-30 MED FILL — SUMATRIPTAN SUCC 100 MG TAB: 100 | 30 days supply | Qty: 9 | Fill #8

## 2016-09-08 MED FILL — NUVARING VAGINAL RING: 0.12-0.015 | 84 days supply | Qty: 3 | Fill #0

## 2016-09-29 MED FILL — SUMATRIPTAN SUCC 100 MG TAB: 100 | 30 days supply | Qty: 9 | Fill #9

## 2016-10-25 ENCOUNTER — Other Ambulatory Visit: Payer: Self-pay | Admitting: Physician Assistant

## 2016-10-25 DIAGNOSIS — G43009 Migraine without aura, not intractable, without status migrainosus: Secondary | ICD-10-CM

## 2016-10-25 NOTE — Telephone Encounter (Signed)
-----   Message from Blanchie Dessert, [place] sent at 10/25/2016  4:36 PM EST ----- Regarding: rx refill Contact: [phone] Pls. Refill Imitrex rx at [place]  thanks

## 2016-10-26 MED FILL — SUMATRIPTAN SUCC 100 MG TAB: 100 | 30 days supply | Qty: 9 | Fill #0

## 2016-12-07 ENCOUNTER — Encounter: Payer: Self-pay | Admitting: Physician Assistant

## 2016-12-07 ENCOUNTER — Ambulatory Visit (INDEPENDENT_AMBULATORY_CARE_PROVIDER_SITE_OTHER): Payer: 59 | Admitting: Physician Assistant

## 2016-12-07 VITALS — BP 128/79 | HR 79 | Wt 144.0 lb

## 2016-12-07 DIAGNOSIS — G4489 Other headache syndrome: Secondary | ICD-10-CM

## 2016-12-07 DIAGNOSIS — N921 Excessive and frequent menstruation with irregular cycle: Secondary | ICD-10-CM

## 2016-12-07 DIAGNOSIS — G43009 Migraine without aura, not intractable, without status migrainosus: Secondary | ICD-10-CM | POA: Diagnosis not present

## 2016-12-07 DIAGNOSIS — M62838 Other muscle spasm: Secondary | ICD-10-CM

## 2016-12-07 DIAGNOSIS — Z975 Presence of (intrauterine) contraceptive device: Secondary | ICD-10-CM

## 2016-12-07 MED ORDER — SUMATRIPTAN SUCCINATE 100 MG PO TABS: ORAL_TABLET | ORAL | 11 refills | Status: DC

## 2016-12-07 MED ORDER — METOCLOPRAMIDE HCL 10 MG PO TABS: 10.0000 mg | ORAL_TABLET | Freq: Three times a day (TID) | ORAL | 1 refills | Status: DC | PRN

## 2016-12-07 MED FILL — SUMATRIPTAN SUCC 100 MG TAB: 100 | 30 days supply | Qty: 18 | Fill #0

## 2016-12-07 MED FILL — METOCLOPRAMIDE 10 MG TABLET: 10 | 10 days supply | Qty: 30 | Fill #0

## 2016-12-07 NOTE — Patient Instructions (Signed)
Check out migraine with aura   Migraine Headache A migraine headache is an intense, throbbing pain on one side or both sides of the head. Migraines may also cause other symptoms, such as nausea, vomiting, and sensitivity to light and noise. What are the causes? Doing or taking certain things may also trigger migraines, such as:  Alcohol.  Smoking.  Medicines, such as:  Medicine used to treat chest pain (nitroglycerine).  Birth control pills.  Estrogen pills.  Certain blood pressure medicines.  Aged cheeses, chocolate, or caffeine.  Foods or drinks that contain nitrates, glutamate, aspartame, or tyramine.  Physical activity. Other things that may trigger a migraine include:  Menstruation.  Pregnancy.  Hunger.  Stress, lack of sleep, too much sleep, or fatigue.  Weather changes. What increases the risk? The following factors may make you more likely to experience migraine headaches:  Age. Risk increases with age.  Family history of migraine headaches.  Being Caucasian.  Depression and anxiety.  Obesity.  Being a woman.  Having a hole in the heart (patent foramen ovale) or other heart problems. What are the signs or symptoms? The main symptom of this condition is pulsating or throbbing pain. Pain may:  Happen in any area of the head, such as on one side or both sides.  Interfere with daily activities.  Get worse with physical activity.  Get worse with exposure to bright lights or loud noises. Other symptoms may include:  Nausea.  Vomiting.  Dizziness.  General sensitivity to bright lights, loud noises, or smells. Before you get a migraine, you may get warning signs that a migraine is developing (aura). An aura may include:  Seeing flashing lights or having blind spots.  Seeing bright spots, halos, or zigzag lines.  Having tunnel vision or blurred vision.  Having numbness or a tingling feeling.  Having trouble talking.  Having muscle  weakness. How is this diagnosed? A migraine headache can be diagnosed based on:  Your symptoms.  A physical exam.  Tests, such as CT scan or MRI of the head. These imaging tests can help rule out other causes of headaches.  Taking fluid from the spine (lumbar puncture) and analyzing it (cerebrospinal fluid analysis, or CSF analysis). How is this treated? A migraine headache is usually treated with medicines that:  Relieve pain.  Relieve nausea.  Prevent migraines from coming back. Treatment may also include:  Acupuncture.  Lifestyle changes like avoiding foods that trigger migraines. Follow these instructions at home: Medicines   Take over-the-counter and prescription medicines only as told by your health care provider.  Do not drive or use heavy machinery while taking prescription pain medicine.  To prevent or treat constipation while you are taking prescription pain medicine, your health care provider may recommend that you:  Drink enough fluid to keep your urine clear or pale yellow.  Take over-the-counter or prescription medicines.  Eat foods that are high in fiber, such as fresh fruits and vegetables, whole grains, and beans.  Limit foods that are high in fat and processed sugars, such as fried and sweet foods. Lifestyle   Avoid alcohol use.  Do not use any products that contain nicotine or tobacco, such as cigarettes and e-cigarettes. If you need help quitting, ask your health care provider.  Get at least 8 hours of sleep every night.  Limit your stress. General instructions    Keep a journal to find out what may trigger your migraine headaches. For example, write down:  What you eat  and drink.  How much sleep you get.  Any change to your diet or medicines.  If you have a migraine:  Avoid things that make your symptoms worse, such as bright lights.  It may help to lie down in a dark, quiet room.  Do not drive or use heavy machinery.  Ask your  health care provider what activities are safe for you while you are experiencing symptoms.  Keep all follow-up visits as told by your health care provider. This is important. Contact a health care provider if:  You develop symptoms that are different or more severe than your usual migraine symptoms. Get help right away if:  Your migraine becomes severe.  You have a fever.  You have a stiff neck.  You have vision loss.  Your muscles feel weak or like you cannot control them.  You start to lose your balance often.  You develop trouble walking.  You faint. This information is not intended to replace advice given to you by your health care provider. Make sure you discuss any questions you have with your health care provider. Document Released: 08/30/2005 Document Revised: 03/19/2016 Document Reviewed: 02/16/2016 Elsevier Interactive Patient Education  2017 [employer].

## 2016-12-07 NOTE — Progress Notes (Signed)
History:  Rachael King is a 41 y.o. [id number] who presents to clinic today for followup of migraine headaches.  She has noticed the Imitrex contributing to nausea for her.  She also had one instance of vision changes for approximately 30 minutes.  It did not occur in any relationship to a headache.  She questioned whether or not she was having a stroke but had no other symptoms and no residual effects.  She has been getting HA most weekends and once during the week, about 6 headaches/month.  Aleve is helpful and sometimes does not require Imitrex but often does.  She continues to have break through bleeding with NuvaRing.  Uses continuously and has not tried any other way of using in the last year.   Patient acknowledges muscle spasm but not interested in using muscle relaxant at this time.  She has used topical cream for this with good results.     Past Medical History:  Diagnosis Date  . No pertinent past medical history     Social History   Social History  . Marital status: Married    Spouse name: N/A  . Number of children: N/A  . Years of education: N/A   Occupational History  . Not on file.   Social History Main Topics  . Smoking status: Never Smoker  . Smokeless tobacco: Never Used  . Alcohol use 0.0 oz/week     Comment: social  . Drug use: No  . Sexual activity: Yes    Partners: Male    Birth control/ protection: Inserts     Comment: Nuvaring   Other Topics Concern  . Not on file   Social History Narrative  . No narrative on file    Family History  Problem Relation Age of Onset  . Heart disease Father     A FIB  . Hypertension Mother     OPEN HEART SURGERY    No Known Allergies  Current Outpatient Prescriptions on File Prior to Visit  Medication Sig Dispense Refill  . calcium carbonate (TUMS - DOSED IN MG ELEMENTAL CALCIUM) 500 MG chewable tablet Chew 2 tablets by mouth daily.    Marland Kitchen etonogestrel-ethinyl estradiol (NUVARING) 0.12-0.015 MG/24HR vaginal ring  Insert vaginally and leave in place for 4 consecutive weeks, then remove and immediately replace with new ring. 1 each 12  . Multiple Vitamin (MULTIVITAMIN) tablet Take 1 tablet by mouth daily.    . naproxen sodium (ANAPROX DS) 550 MG tablet Take 1 tablet (550 mg total) by mouth 2 (two) times daily with a meal. 60 tablet 1  . SUMAtriptan (IMITREX) 100 MG tablet TAKE 1 TABLET BY MOUTH ONCE AS NEEDED FOR MIGRAINE. MAY REPEAT IN 2 HOURS IF HEADACHE PERSISTS OR RECURS. 9 tablet 0   No current facility-administered medications on file prior to visit.      Review of Systems:  All pertinent positive/negative included in HPI, all other review of systems are negative   Objective:  Physical Exam BP 128/79   Pulse 79   Wt 144 lb (65.3 kg)   LMP 11/30/2016   BMI 23.96 kg/m  CONSTITUTIONAL: Well-developed, well-nourished female in no acute distress.  EYES: EOM intact ENT: Normocephalic CARDIOVASCULAR: Regular rate  RESPIRATORY: Normal rate.  MUSCULOSKELETAL: Normal ROM, strength equal bilaterally SKIN: Warm, dry without erythema  NEUROLOGICAL: Alert, oriented, CN II-XII grossly intact, Appropriate balance PSYCH: Normal behavior, mood   Assessment & Plan:  Assessment: 1. Headache associated with hormonal factors   2. Migraine without  aura and without status migrainosus, not intractable   3. Breakthrough bleeding with NuvaRing   4. Muscle spasm   Slight worsening of migraine/less efficacy of acute treatment   Plan: Will add reglan for nausea - with or without Imitrex Continue Imitrex If this is not helpful - pt to call.  We may consider switching to Maxalt or other triptan Pt to be referred to [place] for [facility] acute migraine study of CGRP antagonist Continue healthy lifestyle habits - encourage massage Follow-up in 12 months or sooner PRN  Paticia Stack, PA-C 12/07/2016 9:45 AM

## 2016-12-22 MED FILL — NUVARING VAGINAL RING: 0.12-0.015 | 84 days supply | Qty: 3 | Fill #1

## 2017-01-07 ENCOUNTER — Encounter: Payer: 59 | Admitting: Physician Assistant

## 2017-01-31 DIAGNOSIS — D225 Melanocytic nevi of trunk: Secondary | ICD-10-CM | POA: Diagnosis not present

## 2017-01-31 DIAGNOSIS — D2271 Melanocytic nevi of right lower limb, including hip: Secondary | ICD-10-CM | POA: Diagnosis not present

## 2017-01-31 DIAGNOSIS — D2239 Melanocytic nevi of other parts of face: Secondary | ICD-10-CM | POA: Diagnosis not present

## 2017-01-31 DIAGNOSIS — L821 Other seborrheic keratosis: Secondary | ICD-10-CM | POA: Diagnosis not present

## 2017-01-31 DIAGNOSIS — L918 Other hypertrophic disorders of the skin: Secondary | ICD-10-CM | POA: Diagnosis not present

## 2017-01-31 DIAGNOSIS — L819 Disorder of pigmentation, unspecified: Secondary | ICD-10-CM | POA: Diagnosis not present

## 2017-01-31 DIAGNOSIS — D2261 Melanocytic nevi of right upper limb, including shoulder: Secondary | ICD-10-CM | POA: Diagnosis not present

## 2017-01-31 DIAGNOSIS — D2272 Melanocytic nevi of left lower limb, including hip: Secondary | ICD-10-CM | POA: Diagnosis not present

## 2017-01-31 DIAGNOSIS — D2262 Melanocytic nevi of left upper limb, including shoulder: Secondary | ICD-10-CM | POA: Diagnosis not present

## 2017-02-24 DIAGNOSIS — Z0000 Encounter for general adult medical examination without abnormal findings: Secondary | ICD-10-CM | POA: Diagnosis not present

## 2017-02-28 MED FILL — METOCLOPRAMIDE 10 MG TABLET: 10 | 10 days supply | Qty: 30 | Fill #1

## 2017-02-28 MED FILL — SUMATRIPTAN SUCC 100 MG TAB: 100 | 30 days supply | Qty: 18 | Fill #1

## 2017-03-01 ENCOUNTER — Ambulatory Visit (INDEPENDENT_AMBULATORY_CARE_PROVIDER_SITE_OTHER): Payer: 59 | Admitting: Obstetrics & Gynecology

## 2017-03-01 ENCOUNTER — Encounter: Payer: Self-pay | Admitting: Obstetrics & Gynecology

## 2017-03-01 VITALS — BP 120/80 | HR 76 | Ht 66.0 in | Wt 150.0 lb

## 2017-03-01 DIAGNOSIS — Z01419 Encounter for gynecological examination (general) (routine) without abnormal findings: Secondary | ICD-10-CM

## 2017-03-01 NOTE — Progress Notes (Signed)
GYNECOLOGY ANNUAL PREVENTATIVE CARE ENCOUNTER NOTE  Subjective:   Rachael King is a 41 y.o. G40P2002 female here for a routine annual gynecologic exam.  Current complaints: none.   Denies abnormal vaginal bleeding, discharge, pelvic pain, problems with intercourse or other gynecologic concerns.    Gynecologic History No LMP recorded. Patient is not currently having periods (Reason: Other). Contraception: vasectomy Last Pap: 02/16/2016. Results were: normal with negative HRHPV. Last mammogram: 02/20/2016. Results were: normal  Obstetric History OB History  Gravida Para Term Preterm AB Living  2 2 2  0 0 2  SAB TAB Ectopic Multiple Live Births  0 0 0 0 2    # Outcome Date GA Lbr Len/2nd Weight Sex Delivery Anes PTL Lv  2 Term 09/23/11 [id number] / 00:29 7 lb 3.3 oz (3.27 kg) F Vag-Spont EPI  LIV  1 Term     F Vag-Spont EPI N LIV      Past Medical History:  Diagnosis Date  . No pertinent past medical history     Past Surgical History:  Procedure Laterality Date  . BUNIONECTOMY Left 01/30/2016   [place] bunionectomy with screw fixation left foot second metatarsal osteotomy with double screw fixation left foot.  .Marland KitchenMANDIBLE RECONSTRUCTION    . WISDOM TOOTH EXTRACTION      Current Outpatient Prescriptions on File Prior to Visit  Medication Sig Dispense Refill  . calcium carbonate (TUMS - DOSED IN MG ELEMENTAL CALCIUM) 500 MG chewable tablet Chew 2 tablets by mouth daily.    . metoCLOPramide (REGLAN) 10 MG tablet Take 1 tablet (10 mg total) by mouth 3 (three) times daily as needed for nausea. 30 tablet 1  . Multiple Vitamin (MULTIVITAMIN) tablet Take 1 tablet by mouth daily.    . naproxen sodium (ANAPROX DS) 550 MG tablet Take 1 tablet (550 mg total) by mouth 2 (two) times daily with a meal. 60 tablet 1  . SUMAtriptan (IMITREX) 100 MG tablet TAKE 1 TABLET BY MOUTH ONCE AS NEEDED FOR MIGRAINE. MAY REPEAT IN 2 HOURS IF HEADACHE PERSISTS OR RECURS. 9 tablet 11   No current  facility-administered medications on file prior to visit.     No Known Allergies  Social History   Social History  . Marital status: Married    Spouse name: N/A  . Number of children: N/A  . Years of education: N/A   Occupational History  . Not on file.   Social History Main Topics  . Smoking status: Never Smoker  . Smokeless tobacco: Never Used  . Alcohol use 0.0 oz/week     Comment: social  . Drug use: No  . Sexual activity: Yes    Partners: Male    Birth control/ protection: Inserts     Comment: Nuvaring   Other Topics Concern  . Not on file   Social History Narrative  . No narrative on file    Family History  Problem Relation Age of Onset  . Heart disease Father        A FIB  . Hypertension Mother        OPEN HEART SURGERY    The following portions of the patient's history were reviewed and updated as appropriate: allergies, current medications, past family history, past medical history, past social history, past surgical history and problem list.  Review of Systems Pertinent items noted in HPI and remainder of comprehensive ROS otherwise negative.   Objective:  BP 120/80   Pulse 76   Ht 5'  6" (1.676 m)   Wt 150 lb (68 kg)   BMI 24.21 kg/m  CONSTITUTIONAL: Well-developed, well-nourished female in no acute distress.  HENT:  Normocephalic, atraumatic, External right and left ear normal. Oropharynx is clear and moist EYES: Conjunctivae and EOM are normal. Pupils are equal, round, and reactive to light. No scleral icterus.  NECK: Normal range of motion, supple, no masses.  Normal thyroid.  SKIN: Skin is warm and dry. No rash noted. Not diaphoretic. No erythema. No pallor. NEUROLOGIC: Alert and oriented to person, place, and time. Normal reflexes, muscle tone coordination. No cranial nerve deficit noted. PSYCHIATRIC: Normal mood and affect. Normal behavior. Normal judgment and thought content. CARDIOVASCULAR: Normal heart rate noted, regular  rhythm RESPIRATORY: Clear to auscultation bilaterally. Effort and breath sounds normal, no problems with respiration noted. BREASTS: Symmetric in size. No masses, skin changes, nipple drainage, or lymphadenopathy. ABDOMEN: Soft, normal bowel sounds, no distention noted.  No tenderness, rebound or guarding.  PELVIC: Normal appearing external genitalia; normal appearing vaginal mucosa and cervix.  No abnormal discharge noted.  Pap smear obtained.  Normal uterine size, no other palpable masses, no uterine or adnexal tenderness. MUSCULOSKELETAL: Normal range of motion. No tenderness.  No cyanosis, clubbing, or edema.  2+ distal pulses.   Assessment:  Annual gynecologic examination with pap smear   Plan:  Will follow up results of pap smear and manage accordingly. Mammogram scheduled Routine preventative health maintenance measures emphasized. Please refer to After Visit Summary for other counseling recommendations.    Verita Schneiders, MD, [place] Attending Obstetrician & Gynecologist, [place] for [facility]

## 2017-03-01 NOTE — Patient Instructions (Signed)
Preventive Care 40-64 Years, Female Preventive care refers to lifestyle choices and visits with your health care provider that can promote health and wellness. What does preventive care include?  A yearly physical exam. This is also called an annual well check.  Dental exams once or twice a year.  Routine eye exams. Ask your health care provider how often you should have your eyes checked.  Personal lifestyle choices, including: ? Daily care of your teeth and gums. ? Regular physical activity. ? Eating a healthy diet. ? Avoiding tobacco and drug use. ? Limiting alcohol use. ? Practicing safe sex. ? Taking low-dose aspirin daily starting at age 41. ? Taking vitamin and mineral supplements as recommended by your health care provider. What happens during an annual well check? The services and screenings done by your health care provider during your annual well check will depend on your age, overall health, lifestyle risk factors, and family history of disease. Counseling Your health care provider may ask you questions about your:  Alcohol use.  Tobacco use.  Drug use.  Emotional well-being.  Home and relationship well-being.  Sexual activity.  Eating habits.  Work and work Statistician.  Method of birth control.  Menstrual cycle.  Pregnancy history.  Screening You may have the following tests or measurements:  Height, weight, and BMI.  Blood pressure.  Lipid and cholesterol levels. These may be checked every 5 years, or more frequently if you are over 41 years old.  Skin check.  Lung cancer screening. You may have this screening every year starting at age 41 if you have a 30-pack-year history of smoking and currently smoke or have quit within the past 15 years.  Fecal occult blood test (FOBT) of the stool. You may have this test every year starting at age 41.  Flexible sigmoidoscopy or colonoscopy. You may have a sigmoidoscopy every 5 years or a colonoscopy  every 10 years starting at age 41.  Hepatitis C blood test.  Hepatitis B blood test.  Sexually transmitted disease (STD) testing.  Diabetes screening. This is done by checking your blood sugar (glucose) after you have not eaten for a while (fasting). You may have this done every 1-3 years.  Mammogram. This may be done every 1-2 years. Talk to your health care provider about when you should start having regular mammograms. This may depend on whether you have a family history of breast cancer.  BRCA-related cancer screening. This may be done if you have a family history of breast, ovarian, tubal, or peritoneal cancers.  Pelvic exam and Pap test. This may be done every 3 years starting at age 41. Starting at age 36, this may be done every 5 years if you have a Pap test in combination with an HPV test.  Bone density scan. This is done to screen for osteoporosis. You may have this scan if you are at high risk for osteoporosis.  Discuss your test results, treatment options, and if necessary, the need for more tests with your health care provider. Vaccines Your health care provider may recommend certain vaccines, such as:  Influenza vaccine. This is recommended every year.  Tetanus, diphtheria, and acellular pertussis (Tdap, Td) vaccine. You may need a Td booster every 10 years.  Varicella vaccine. You may need this if you have not been vaccinated.  Zoster vaccine. You may need this after age 41.  Measles, mumps, and rubella (MMR) vaccine. You may need at least one dose of MMR if you were born in  1957 or later. You may also need a second dose.  Pneumococcal 13-valent conjugate (PCV13) vaccine. You may need this if you have certain conditions and were not previously vaccinated.  Pneumococcal polysaccharide (PPSV23) vaccine. You may need one or two doses if you smoke cigarettes or if you have certain conditions.  Meningococcal vaccine. You may need this if you have certain  conditions.  Hepatitis A vaccine. You may need this if you have certain conditions or if you travel or work in places where you may be exposed to hepatitis A.  Hepatitis B vaccine. You may need this if you have certain conditions or if you travel or work in places where you may be exposed to hepatitis B.  Haemophilus influenzae type b (Hib) vaccine. You may need this if you have certain conditions.  Talk to your health care provider about which screenings and vaccines you need and how often you need them. This information is not intended to replace advice given to you by your health care provider. Make sure you discuss any questions you have with your health care provider. Document Released: 09/26/2015 Document Revised: 05/19/2016 Document Reviewed: 07/01/2015 Elsevier Interactive Patient Education  2017 [employer].

## 2017-03-01 NOTE — Progress Notes (Signed)
Normal pap - 02/2016 Normal MM - 02/2016

## 2017-03-02 DIAGNOSIS — Z0000 Encounter for general adult medical examination without abnormal findings: Secondary | ICD-10-CM | POA: Diagnosis not present

## 2017-03-03 LAB — CYTOLOGY - PAP: DIAGNOSIS: NEGATIVE

## 2017-03-10 ENCOUNTER — Ambulatory Visit (HOSPITAL_COMMUNITY)
Admission: RE | Admit: 2017-03-10 | Discharge: 2017-03-10 | Disposition: A | Discharge status: Home / Self Care | Payer: 59 | Source: Ambulatory Visit | Attending: Obstetrics & Gynecology | Admitting: Obstetrics & Gynecology

## 2017-03-10 DIAGNOSIS — Z1231 Encounter for screening mammogram for malignant neoplasm of breast: Secondary | ICD-10-CM | POA: Insufficient documentation

## 2017-03-10 DIAGNOSIS — Z01419 Encounter for gynecological examination (general) (routine) without abnormal findings: Secondary | ICD-10-CM

## 2017-03-11 ENCOUNTER — Ambulatory Visit (HOSPITAL_COMMUNITY): Payer: 59

## 2017-05-09 ENCOUNTER — Telehealth: Payer: 59 | Admitting: Family

## 2017-05-09 DIAGNOSIS — R102 Pelvic and perineal pain: Secondary | ICD-10-CM

## 2017-05-09 NOTE — Progress Notes (Signed)
Based on what you shared with me it looks like you have a serious condition that should be evaluated in a face to face office visit.  NOTE: Even if you have entered your credit card information for this eVisit, you will not be charged.   I am unsure from your questionnaire if you are having bacterial vaginosis or UTI symptoms. You would be better served seeing a provider face to face to evaluate your symptoms.   If you are having a true medical emergency please call 911.  If you need an urgent face to face visit, [facility] has four urgent care centers for your convenience.  If you need care fast and have a high deductible or no insurance consider:   [email or web address]  [phone]  [address], [place], [place] [address] 8 am to 8 pm Monday-Friday 10 am to 4 pm Saturday-Sunday   The following sites will take your  insurance:    . [facility] [place] a Provider at this Location  [address] [place], [place] [address] . 10 am to 8 pm Monday-Friday . 12 pm to 8 pm Saturday-Sunday   . [facility] at [place] a Provider at this Location  [address] [place] [address], [place], [place] [address] . 8 am to 8 pm Monday-Friday . 9 am to 6 pm Saturday . 11 am to 6 pm Sunday   . [facility] at [place] Get Driving Directions  [address] [place],  [address] . 8 am to 8 pm Monday-Friday . 8 am to 4 pm Saturday-Sunday   Your e-visit answers were reviewed by a board certified advanced clinical practitioner to complete your personal care plan.  Thank you for using e-Visits.

## 2017-06-01 MED FILL — SUMATRIPTAN SUCC 100 MG TAB: 100 | 30 days supply | Qty: 18 | Fill #2

## 2017-06-06 DIAGNOSIS — J020 Streptococcal pharyngitis: Secondary | ICD-10-CM | POA: Diagnosis not present

## 2017-06-06 DIAGNOSIS — G43109 Migraine with aura, not intractable, without status migrainosus: Secondary | ICD-10-CM | POA: Diagnosis not present

## 2017-06-06 MED FILL — METOCLOPRAMIDE 10 MG TABLET: 10 | 10 days supply | Qty: 30 | Fill #0

## 2017-07-18 DIAGNOSIS — H5213 Myopia, bilateral: Secondary | ICD-10-CM | POA: Diagnosis not present

## 2017-07-18 DIAGNOSIS — H52223 Regular astigmatism, bilateral: Secondary | ICD-10-CM | POA: Diagnosis not present

## 2017-09-22 ENCOUNTER — Telehealth: Payer: Self-pay | Admitting: Radiology

## 2017-09-22 NOTE — Telephone Encounter (Signed)
Left message on the cell phone voicemail to call [email or web address] to schedule headache f/u with Allie Dimmer in March.

## 2017-10-03 MED FILL — SUMATRIPTAN SUCC 100 MG TAB: 100 | 30 days supply | Qty: 8 | Fill #3

## 2017-11-11 ENCOUNTER — Ambulatory Visit (INDEPENDENT_AMBULATORY_CARE_PROVIDER_SITE_OTHER): Payer: 59 | Admitting: Physician Assistant

## 2017-11-11 ENCOUNTER — Encounter: Payer: Self-pay | Admitting: Physician Assistant

## 2017-11-11 VITALS — BP 128/74 | Wt 147.8 lb

## 2017-11-11 DIAGNOSIS — G4489 Other headache syndrome: Secondary | ICD-10-CM | POA: Diagnosis not present

## 2017-11-11 DIAGNOSIS — G43109 Migraine with aura, not intractable, without status migrainosus: Secondary | ICD-10-CM

## 2017-11-11 DIAGNOSIS — G43009 Migraine without aura, not intractable, without status migrainosus: Secondary | ICD-10-CM | POA: Diagnosis not present

## 2017-11-11 MED ORDER — ONDANSETRON 4 MG PO TBDP: 4.0000 mg | ORAL_TABLET | Freq: Four times a day (QID) | ORAL | 0 refills | Status: DC | PRN

## 2017-11-11 MED ORDER — SUMATRIPTAN SUCCINATE 100 MG PO TABS: ORAL_TABLET | ORAL | 11 refills | Status: DC

## 2017-11-11 MED FILL — ONDANSETRON ODT 4 MG TABLET: 4 | 5 days supply | Qty: 20 | Fill #0

## 2017-11-11 MED FILL — SUMATRIPTAN SUCC 100 MG TAB: 100 | 30 days supply | Qty: 8 | Fill #0

## 2017-11-11 NOTE — Progress Notes (Signed)
History:  Rachael King is a 42 y.o. [id number] who presents to clinic today for migraine followup.  She continues to do quite well.  Last year, she discontinued her Nuva Ring due to new onset Aura.  (No contraception required due to husband's vasectomy.)  She has noticed her headaches are generally around her menstrual cycle and are quite severe.  She gets nauseous with them and the reglan did not help.       Number of days in the last 4 weeks with:  Severe headache: 4 Moderate headache: 0 Mild headache: 0  No headache: 24   Past Medical History:  Diagnosis Date  . No pertinent past medical history     Social History   Socioeconomic History  . Marital status: Married    Spouse name: Not on file  . Number of children: Not on file  . Years of education: Not on file  . Highest education level: Not on file  Social Needs  . Financial resource strain: Not on file  . Food insecurity - worry: Not on file  . Food insecurity - inability: Not on file  . Transportation needs - medical: Not on file  . Transportation needs - non-medical: Not on file  Occupational History  . Not on file  Tobacco Use  . Smoking status: Never Smoker  . Smokeless tobacco: Never Used  Substance and Sexual Activity  . Alcohol use: Yes    Alcohol/week: 0.0 oz    Comment: social  . Drug use: No  . Sexual activity: Yes    Partners: Male    Birth control/protection: Inserts    Comment: Nuvaring  Other Topics Concern  . Not on file  Social History Narrative  . Not on file    Family History  Problem Relation Age of Onset  . Heart disease Father        A FIB  . Hypertension Mother        OPEN HEART SURGERY    No Known Allergies  Current Outpatient Medications on File Prior to Visit  Medication Sig Dispense Refill  . metoCLOPramide (REGLAN) 10 MG tablet Take 1 tablet (10 mg total) by mouth 3 (three) times daily as needed for nausea. 30 tablet 1  . Multiple Vitamin (MULTIVITAMIN) tablet Take 1  tablet by mouth daily.    . naproxen sodium (ANAPROX DS) 550 MG tablet Take 1 tablet (550 mg total) by mouth 2 (two) times daily with a meal. 60 tablet 1  . SUMAtriptan (IMITREX) 100 MG tablet TAKE 1 TABLET BY MOUTH ONCE AS NEEDED FOR MIGRAINE. MAY REPEAT IN 2 HOURS IF HEADACHE PERSISTS OR RECURS. 9 tablet 11  . calcium carbonate (TUMS - DOSED IN MG ELEMENTAL CALCIUM) 500 MG chewable tablet Chew 2 tablets by mouth daily.     No current facility-administered medications on file prior to visit.      Review of Systems:  All pertinent positive/negative included in HPI, all other review of systems are negative   Objective:  Physical Exam BP 128/74   Wt 147 lb 12.8 oz (67 kg)   BMI 23.86 kg/m  CONSTITUTIONAL: Well-developed, well-nourished female in no acute distress.  EYES: EOM intact ENT: Normocephalic CARDIOVASCULAR: Regular rate RESPIRATORY: Normal rate.  MUSCULOSKELETAL: Normal ROM SKIN: Warm, dry without erythema  NEUROLOGICAL: Alert, oriented, CN II-XII grossly intact, Appropriate balance PSYCH: Normal behavior, mood   Assessment & Plan:  Assessment: 1. Migraine with aura and without status migrainosus, not intractable   2. Headache  associated with hormonal factors   3. Migraine without aura and without status migrainosus, not intractable     Plan: Pt will use zofran in place of reglan to help with the nausea.  She will begin Aleve prior to the expected menstrual HA and maintain that BID through the expected HA time period.  Other meds refilled per usual.   We discussed the option for migraine prevention medications and Rachael King is not interested at this time.   Follow-up in 12 months or sooner PRN  Paticia Stack, PA-C 11/11/2017 9:45 AM

## 2017-11-11 NOTE — Patient Instructions (Signed)
Migraine Headache A migraine headache is a very strong throbbing pain on one side or both sides of your head. Migraines can also cause other symptoms. Talk with your doctor about what things may bring on (trigger) your migraine headaches. Follow these instructions at home: Medicines  Take over-the-counter and prescription medicines only as told by your doctor.  Do not drive or use heavy machinery while taking prescription pain medicine.  To prevent or treat constipation while you are taking prescription pain medicine, your doctor may recommend that you: ? Drink enough fluid to keep your pee (urine) clear or pale yellow. ? Take over-the-counter or prescription medicines. ? Eat foods that are high in fiber. These include fresh fruits and vegetables, whole grains, and beans. ? Limit foods that are high in fat and processed sugars. These include fried and sweet foods. Lifestyle  Avoid alcohol.  Do not use any products that contain nicotine or tobacco, such as cigarettes and e-cigarettes. If you need help quitting, ask your doctor.  Get at least 8 hours of sleep every night.  Limit your stress. General instructions   Keep a journal to find out what may bring on your migraines. For example, write down: ? What you eat and drink. ? How much sleep you get. ? Any change in what you eat or drink. ? Any change in your medicines.  If you have a migraine: ? Avoid things that make your symptoms worse, such as bright lights. ? It may help to lie down in a dark, quiet room. ? Do not drive or use heavy machinery. ? Ask your doctor what activities are safe for you.  Keep all follow-up visits as told by your doctor. This is important. Contact a doctor if:  You get a migraine that is different or worse than your usual migraines. Get help right away if:  Your migraine gets very bad.  You have a fever.  You have a stiff neck.  You have trouble seeing.  Your muscles feel weak or like you  cannot control them.  You start to lose your balance a lot.  You start to have trouble walking.  You pass out (faint). This information is not intended to replace advice given to you by your health care provider. Make sure you discuss any questions you have with your health care provider. Document Released: 06/08/2008 Document Revised: 03/19/2016 Document Reviewed: 02/16/2016 Elsevier Interactive Patient Education  2018 [employer].

## 2017-12-14 ENCOUNTER — Encounter: Payer: Self-pay | Admitting: Radiology

## 2017-12-26 MED FILL — SUMATRIPTAN SUCC 100 MG TAB: 100 | 30 days supply | Qty: 8 | Fill #1

## 2018-02-14 ENCOUNTER — Other Ambulatory Visit: Payer: Self-pay | Admitting: Obstetrics & Gynecology

## 2018-02-14 DIAGNOSIS — Z1231 Encounter for screening mammogram for malignant neoplasm of breast: Secondary | ICD-10-CM

## 2018-03-02 DIAGNOSIS — Z0000 Encounter for general adult medical examination without abnormal findings: Secondary | ICD-10-CM | POA: Diagnosis not present

## 2018-03-02 NOTE — Progress Notes (Signed)
GYNECOLOGY ANNUAL PREVENTATIVE CARE ENCOUNTER NOTE  Subjective:   Rachael King is a 42 y.o. G54P2002 female here for a routine annual gynecologic exam.  Current complaints: heavier and prolonged periods over the past few months; cycle length about 3 weeks now.  Normal CBC and TSH at her PCP's office.   Denies abnormal vaginal discharge, pelvic pain, problems with intercourse or other gynecologic concerns.    Gynecologic History Patient's last menstrual period was 02/19/2018. Contraception: vasectomy Last Pap: 03/01/2017. Results were: normal with negative HPV Last mammogram: 03/10/2018. Results were: normal; next one scheduled 03/13/2018  Obstetric History OB History  Gravida Para Term Preterm AB Living  2 2 2  0 0 2  SAB TAB Ectopic Multiple Live Births  0 0 0 0 2    # Outcome Date GA Lbr Len/2nd Weight Sex Delivery Anes PTL Lv  2 Term 09/23/11 [id number]:20 / 00:29 7 lb 3.3 oz (3.27 kg) F Vag-Spont EPI  LIV  1 Term     F Vag-Spont EPI N LIV    Past Medical History:  Diagnosis Date  . No pertinent past medical history     Past Surgical History:  Procedure Laterality Date  . BUNIONECTOMY Left 01/30/2016   [place] bunionectomy with screw fixation left foot second metatarsal osteotomy with double screw fixation left foot.  .Marland KitchenMANDIBLE RECONSTRUCTION    . WISDOM TOOTH EXTRACTION      Current Outpatient Medications on File Prior to Visit  Medication Sig Dispense Refill  . calcium carbonate (TUMS - DOSED IN MG ELEMENTAL CALCIUM) 500 MG chewable tablet Chew 2 tablets by mouth daily.    . metoCLOPramide (REGLAN) 10 MG tablet Take by mouth.    . Multiple Vitamin (MULTIVITAMIN) tablet Take 1 tablet by mouth daily.    . naproxen sodium (ANAPROX DS) 550 MG tablet Take 1 tablet (550 mg total) by mouth 2 (two) times daily with a meal. 60 tablet 1  . ondansetron (ZOFRAN ODT) 4 MG disintegrating tablet Take 1 tablet (4 mg total) by mouth every 6 (six) hours as needed for nausea. 20 tablet 0   . SUMAtriptan (IMITREX) 100 MG tablet TAKE 1 TABLET BY MOUTH ONCE AS NEEDED FOR MIGRAINE. MAY REPEAT IN 2 HOURS IF HEADACHE PERSISTS OR RECURS. 9 tablet 11   No current facility-administered medications on file prior to visit.     No Known Allergies  Social History   Socioeconomic History  . Marital status: Married    Spouse name: Not on file  . Number of children: Not on file  . Years of education: Not on file  . Highest education level: Not on file  Occupational History  . Not on file  Social Needs  . Financial resource strain: Not on file  . Food insecurity:    Worry: Not on file    Inability: Not on file  . Transportation needs:    Medical: Not on file    Non-medical: Not on file  Tobacco Use  . Smoking status: Never Smoker  . Smokeless tobacco: Never Used  Substance and Sexual Activity  . Alcohol use: Yes    Alcohol/week: 0.0 oz    Comment: social  . Drug use: No  . Sexual activity: Yes    Partners: Male    Comment: Vasectomy  Lifestyle  . Physical activity:    Days per week: Not on file    Minutes per session: Not on file  . Stress: Not on file  Relationships  .  Social connections:    Talks on phone: Not on file    Gets together: Not on file    Attends religious service: Not on file    Active member of club or organization: Not on file    Attends meetings of clubs or organizations: Not on file    Relationship status: Not on file  . Intimate partner violence:    Fear of current or ex partner: Not on file    Emotionally abused: Not on file    Physically abused: Not on file    Forced sexual activity: Not on file  Other Topics Concern  . Not on file  Social History Narrative  . Not on file    Family History  Problem Relation Age of Onset  . Heart disease Father        A FIB  . Hypertension Mother        OPEN HEART SURGERY    The following portions of the patient's history were reviewed and updated as appropriate: allergies, current medications,  past family history, past medical history, past social history, past surgical history and problem list.  Review of Systems Pertinent items noted in HPI and remainder of comprehensive ROS otherwise negative.   Objective:  BP 119/79   Pulse 79   Ht 5' 6"  (1.676 m)   Wt 149 lb 12.8 oz (67.9 kg)   LMP 02/19/2018   BMI 24.18 kg/m  CONSTITUTIONAL: Well-developed, well-nourished female in no acute distress.  HENT:  Normocephalic, atraumatic, External right and left ear normal. Oropharynx is clear and moist EYES: Conjunctivae and EOM are normal. Pupils are equal, round, and reactive to light. No scleral icterus.  NECK: Normal range of motion, supple, no masses.  Normal thyroid.  SKIN: Skin is warm and dry. No rash noted. Not diaphoretic. No erythema. No pallor. MUSCULOSKELETAL: Normal range of motion. No tenderness.  No cyanosis, clubbing, or edema.  2+ distal pulses. NEUROLOGIC: Alert and oriented to person, place, and time. Normal reflexes, muscle tone coordination. No cranial nerve deficit noted. PSYCHIATRIC: Normal mood and affect. Normal behavior. Normal judgment and thought content. CARDIOVASCULAR: Normal heart rate noted, regular rhythm RESPIRATORY: Clear to auscultation bilaterally. Effort and breath sounds normal, no problems with respiration noted. BREASTS: Symmetric in size. No masses, skin changes, nipple drainage, or lymphadenopathy. ABDOMEN: Soft, normal bowel sounds, no distention noted.  No tenderness, rebound or guarding.  PELVIC: Normal appearing external genitalia; normal appearing vaginal mucosa and cervix with prominent ectropion.  No abnormal discharge noted.  Pap smear obtained.  Normal uterine size, no other palpable masses, no uterine or adnexal tenderness.   Assessment and Plan:  1. Encounter for gynecological examination with Papanicolaou smear of cervix Will follow up results of pap smear and manage accordingly. Mammogram already scheduled Will obtain pelvic  ultrasound to evaluate for any uterine structural anomalies given heavier and prolonged periods; Naproxen recommended for now.  Will follow up results and manage accordingly. Routine preventative health maintenance measures emphasized. Please refer to After Visit Summary for other counseling recommendations.    Verita Schneiders, MD, [place] for [employer], [place]

## 2018-03-06 ENCOUNTER — Ambulatory Visit (INDEPENDENT_AMBULATORY_CARE_PROVIDER_SITE_OTHER): Payer: 59 | Admitting: Obstetrics & Gynecology

## 2018-03-06 ENCOUNTER — Encounter: Payer: Self-pay | Admitting: Obstetrics & Gynecology

## 2018-03-06 VITALS — BP 119/79 | HR 79 | Ht 66.0 in | Wt 149.8 lb

## 2018-03-06 DIAGNOSIS — Z113 Encounter for screening for infections with a predominantly sexual mode of transmission: Secondary | ICD-10-CM | POA: Diagnosis not present

## 2018-03-06 DIAGNOSIS — Z0000 Encounter for general adult medical examination without abnormal findings: Secondary | ICD-10-CM | POA: Diagnosis not present

## 2018-03-06 DIAGNOSIS — E7800 Pure hypercholesterolemia, unspecified: Secondary | ICD-10-CM | POA: Diagnosis not present

## 2018-03-06 DIAGNOSIS — Z124 Encounter for screening for malignant neoplasm of cervix: Secondary | ICD-10-CM | POA: Diagnosis not present

## 2018-03-06 DIAGNOSIS — Z1151 Encounter for screening for human papillomavirus (HPV): Secondary | ICD-10-CM

## 2018-03-06 DIAGNOSIS — Z01419 Encounter for gynecological examination (general) (routine) without abnormal findings: Secondary | ICD-10-CM | POA: Diagnosis not present

## 2018-03-06 NOTE — Patient Instructions (Signed)
Preventive Care 42-64 Years, Female Preventive care refers to lifestyle choices and visits with your health care provider that can promote health and wellness. What does preventive care include?  A yearly physical exam. This is also called an annual well check.  Dental exams once or twice a year.  Routine eye exams. Ask your health care provider how often you should have your eyes checked.  Personal lifestyle choices, including: ? Daily care of your teeth and gums. ? Regular physical activity. ? Eating a healthy diet. ? Avoiding tobacco and drug use. ? Limiting alcohol use. ? Practicing safe sex. ? Taking low-dose aspirin daily starting at age 42. ? Taking vitamin and mineral supplements as recommended by your health care provider. What happens during an annual well check? The services and screenings done by your health care provider during your annual well check will depend on your age, overall health, lifestyle risk factors, and family history of disease. Counseling Your health care provider may ask you questions about your:  Alcohol use.  Tobacco use.  Drug use.  Emotional well-being.  Home and relationship well-being.  Sexual activity.  Eating habits.  Work and work Statistician.  Method of birth control.  Menstrual cycle.  Pregnancy history.  Screening You may have the following tests or measurements:  Height, weight, and BMI.  Blood pressure.  Lipid and cholesterol levels. These may be checked every 5 years, or more frequently if you are over 42 years old.  Skin check.  Lung cancer screening. You may have this screening every year starting at age 42 if you have a 30-pack-year history of smoking and currently smoke or have quit within the past 15 years.  Fecal occult blood test (FOBT) of the stool. You may have this test every year starting at age 42.  Flexible sigmoidoscopy or colonoscopy. You may have a sigmoidoscopy every 5 years or a colonoscopy  every 10 years starting at age 42.  Hepatitis C blood test.  Hepatitis B blood test.  Sexually transmitted disease (STD) testing.  Diabetes screening. This is done by checking your blood sugar (glucose) after you have not eaten for a while (fasting). You may have this done every 1-3 years.  Mammogram. This may be done every 1-2 years. Talk to your health care provider about when you should start having regular mammograms. This may depend on whether you have a family history of breast cancer.  BRCA-related cancer screening. This may be done if you have a family history of breast, ovarian, tubal, or peritoneal cancers.  Pelvic exam and Pap test. This may be done every 3 years starting at age 42. Starting at age 42, this may be done every 5 years if you have a Pap test in combination with an HPV test.  Bone density scan. This is done to screen for osteoporosis. You may have this scan if you are at high risk for osteoporosis.  Discuss your test results, treatment options, and if necessary, the need for more tests with your health care provider. Vaccines Your health care provider may recommend certain vaccines, such as:  Influenza vaccine. This is recommended every year.  Tetanus, diphtheria, and acellular pertussis (Tdap, Td) vaccine. You may need a Td booster every 10 years.  Varicella vaccine. You may need this if you have not been vaccinated.  Zoster vaccine. You may need this after age 42.  Measles, mumps, and rubella (MMR) vaccine. You may need at least one dose of MMR if you were born in  1957 or later. You may also need a second dose.  Pneumococcal 13-valent conjugate (PCV13) vaccine. You may need this if you have certain conditions and were not previously vaccinated.  Pneumococcal polysaccharide (PPSV23) vaccine. You may need one or two doses if you smoke cigarettes or if you have certain conditions.  Meningococcal vaccine. You may need this if you have certain  conditions.  Hepatitis A vaccine. You may need this if you have certain conditions or if you travel or work in places where you may be exposed to hepatitis A.  Hepatitis B vaccine. You may need this if you have certain conditions or if you travel or work in places where you may be exposed to hepatitis B.  Haemophilus influenzae type b (Hib) vaccine. You may need this if you have certain conditions.  Talk to your health care provider about which screenings and vaccines you need and how often you need them. This information is not intended to replace advice given to you by your health care provider. Make sure you discuss any questions you have with your health care provider. Document Released: 09/26/2015 Document Revised: 05/19/2016 Document Reviewed: 07/01/2015 Elsevier Interactive Patient Education  2018 [employer] Inc[employer]  

## 2018-03-06 NOTE — Progress Notes (Signed)
Pt states that her cycles have been coming closer together, and heavier since 3-4 months.

## 2018-03-07 LAB — CYTOLOGY - PAP
BACTERIAL VAGINITIS: NEGATIVE
CANDIDA VAGINITIS: NEGATIVE
CHLAMYDIA, DNA PROBE: NEGATIVE
Diagnosis: NEGATIVE
HPV (WINDOPATH): NOT DETECTED
Neisseria Gonorrhea: NEGATIVE
TRICH (WINDOWPATH): NEGATIVE

## 2018-03-13 ENCOUNTER — Ambulatory Visit (HOSPITAL_COMMUNITY)
Admission: RE | Admit: 2018-03-13 | Discharge: 2018-03-13 | Disposition: A | Discharge status: Home / Self Care | Payer: 59 | Source: Ambulatory Visit | Attending: Obstetrics & Gynecology | Admitting: Obstetrics & Gynecology

## 2018-03-13 ENCOUNTER — Other Ambulatory Visit: Payer: Self-pay | Admitting: Obstetrics & Gynecology

## 2018-03-13 ENCOUNTER — Encounter (HOSPITAL_COMMUNITY): Payer: Self-pay

## 2018-03-13 DIAGNOSIS — Z1231 Encounter for screening mammogram for malignant neoplasm of breast: Secondary | ICD-10-CM | POA: Diagnosis not present

## 2018-03-13 DIAGNOSIS — N920 Excessive and frequent menstruation with regular cycle: Secondary | ICD-10-CM | POA: Diagnosis not present

## 2018-03-13 DIAGNOSIS — Z01419 Encounter for gynecological examination (general) (routine) without abnormal findings: Secondary | ICD-10-CM

## 2018-03-14 DIAGNOSIS — D2221 Melanocytic nevi of right ear and external auricular canal: Secondary | ICD-10-CM | POA: Diagnosis not present

## 2018-03-14 DIAGNOSIS — D2271 Melanocytic nevi of right lower limb, including hip: Secondary | ICD-10-CM | POA: Diagnosis not present

## 2018-03-14 DIAGNOSIS — D225 Melanocytic nevi of trunk: Secondary | ICD-10-CM | POA: Diagnosis not present

## 2018-03-14 DIAGNOSIS — D2262 Melanocytic nevi of left upper limb, including shoulder: Secondary | ICD-10-CM | POA: Diagnosis not present

## 2018-03-14 DIAGNOSIS — L819 Disorder of pigmentation, unspecified: Secondary | ICD-10-CM | POA: Diagnosis not present

## 2018-03-14 DIAGNOSIS — L918 Other hypertrophic disorders of the skin: Secondary | ICD-10-CM | POA: Diagnosis not present

## 2018-03-14 DIAGNOSIS — L820 Inflamed seborrheic keratosis: Secondary | ICD-10-CM | POA: Diagnosis not present

## 2018-03-14 DIAGNOSIS — D2239 Melanocytic nevi of other parts of face: Secondary | ICD-10-CM | POA: Diagnosis not present

## 2018-03-14 DIAGNOSIS — D485 Neoplasm of uncertain behavior of skin: Secondary | ICD-10-CM | POA: Diagnosis not present

## 2018-03-14 DIAGNOSIS — L821 Other seborrheic keratosis: Secondary | ICD-10-CM | POA: Diagnosis not present

## 2018-06-26 MED FILL — SUMAtriptan SUCCINATE 100 M: 100 | 30 days supply | Qty: 8 | Fill #2

## 2018-07-19 DIAGNOSIS — H5213 Myopia, bilateral: Secondary | ICD-10-CM | POA: Diagnosis not present

## 2018-09-01 DIAGNOSIS — E7800 Pure hypercholesterolemia, unspecified: Secondary | ICD-10-CM | POA: Diagnosis not present

## 2018-09-14 MED FILL — SUMAtriptan SUCCINATE 100 M: 100 | 30 days supply | Qty: 8 | Fill #3

## 2018-09-28 ENCOUNTER — Telehealth: Payer: Self-pay | Admitting: Radiology

## 2018-09-28 NOTE — Telephone Encounter (Signed)
Left message for patient to call [id number] for f/u headache appointment with Allie Dimmer

## 2018-11-24 ENCOUNTER — Other Ambulatory Visit: Payer: Self-pay

## 2018-11-24 ENCOUNTER — Ambulatory Visit (INDEPENDENT_AMBULATORY_CARE_PROVIDER_SITE_OTHER): Payer: 59 | Admitting: Physician Assistant

## 2018-11-24 ENCOUNTER — Encounter: Payer: Self-pay | Admitting: Physician Assistant

## 2018-11-24 VITALS — BP 116/76 | HR 72 | Wt 143.4 lb

## 2018-11-24 DIAGNOSIS — G4489 Other headache syndrome: Secondary | ICD-10-CM | POA: Diagnosis not present

## 2018-11-24 DIAGNOSIS — G43009 Migraine without aura, not intractable, without status migrainosus: Secondary | ICD-10-CM

## 2018-11-24 MED ORDER — SUMATRIPTAN SUCCINATE 100 MG PO TABS: ORAL_TABLET | ORAL | 11 refills | Status: DC

## 2018-11-24 MED FILL — SUMAtriptan SUCCINATE 100 M: 100 | 30 days supply | Qty: 9 | Fill #0

## 2018-11-24 NOTE — Patient Instructions (Signed)
Migraine Headache  A migraine headache is a very strong throbbing pain on one side or both sides of your head. Migraines can also cause other symptoms. Talk with your doctor about what things may bring on (trigger) your migraine headaches. Follow these instructions at home: Medicines  Take over-the-counter and prescription medicines only as told by your doctor.  Do not drive or use heavy machinery while taking prescription pain medicine.  To prevent or treat constipation while you are taking prescription pain medicine, your doctor may recommend that you: ? Drink enough fluid to keep your pee (urine) clear or pale yellow. ? Take over-the-counter or prescription medicines. ? Eat foods that are high in fiber. These include fresh fruits and vegetables, whole grains, and beans. ? Limit foods that are high in fat and processed sugars. These include fried and sweet foods. Lifestyle  Avoid alcohol.  Do not use any products that contain nicotine or tobacco, such as cigarettes and e-cigarettes. If you need help quitting, ask your doctor.  Get at least 8 hours of sleep every night.  Limit your stress. General instructions   Keep a journal to find out what may bring on your migraines. For example, write down: ? What you eat and drink. ? How much sleep you get. ? Any change in what you eat or drink. ? Any change in your medicines.  If you have a migraine: ? Avoid things that make your symptoms worse, such as bright lights. ? It may help to lie down in a dark, quiet room. ? Do not drive or use heavy machinery. ? Ask your doctor what activities are safe for you.  Keep all follow-up visits as told by your doctor. This is important. Contact a doctor if:  You get a migraine that is different or worse than your usual migraines. Get help right away if:  Your migraine gets very bad.  You have a fever.  You have a stiff neck.  You have trouble seeing.  Your muscles feel weak or like  you cannot control them.  You start to lose your balance a lot.  You start to have trouble walking.  You pass out (faint). This information is not intended to replace advice given to you by your health care provider. Make sure you discuss any questions you have with your health care provider. Document Released: 06/08/2008 Document Revised: 05/24/2018 Document Reviewed: 02/16/2016 Elsevier Interactive Patient Education  2019 [employer].

## 2018-11-24 NOTE — Progress Notes (Signed)
History:  Rachael King is a 43 y.o. G2P2002 who presents to clinic today for yearly headache eval.  She notes they got better when she discontinued diet sodas.  She is wearing her bite plate regularly.  She notes the HAs got worse around the holidays.  Then she started red yeast rice and CoQ10.   Aleve, Imitrex still helpful.  Imitrex only needed once in last 3 months. Zofran not tried.     Past Medical History:  Diagnosis Date  . No pertinent past medical history     Social History   Socioeconomic History  . Marital status: Married    Spouse name: Not on file  . Number of children: Not on file  . Years of education: Not on file  . Highest education level: Not on file  Occupational History  . Not on file  Social Needs  . Financial resource strain: Not on file  . Food insecurity:    Worry: Not on file    Inability: Not on file  . Transportation needs:    Medical: Not on file    Non-medical: Not on file  Tobacco Use  . Smoking status: Never Smoker  . Smokeless tobacco: Never Used  Substance and Sexual Activity  . Alcohol use: Yes    Alcohol/week: 0.0 standard drinks    Comment: social  . Drug use: No  . Sexual activity: Yes    Partners: Male    Comment: Vasectomy  Lifestyle  . Physical activity:    Days per week: Not on file    Minutes per session: Not on file  . Stress: Not on file  Relationships  . Social connections:    Talks on phone: Not on file    Gets together: Not on file    Attends religious service: Not on file    Active member of club or organization: Not on file    Attends meetings of clubs or organizations: Not on file    Relationship status: Not on file  . Intimate partner violence:    Fear of current or ex partner: Not on file    Emotionally abused: Not on file    Physically abused: Not on file    Forced sexual activity: Not on file  Other Topics Concern  . Not on file  Social History Narrative  . Not on file    Family History  Problem  Relation Age of Onset  . Heart disease Father        A FIB  . Hypertension Mother        OPEN HEART SURGERY    No Known Allergies  Current Outpatient Medications on File Prior to Visit  Medication Sig Dispense Refill  . calcium carbonate (TUMS - DOSED IN MG ELEMENTAL CALCIUM) 500 MG chewable tablet Chew 2 tablets by mouth daily.    . metoCLOPramide (REGLAN) 10 MG tablet Take by mouth.    . Multiple Vitamin (MULTIVITAMIN) tablet Take 1 tablet by mouth daily.    . SUMAtriptan (IMITREX) 100 MG tablet TAKE 1 TABLET BY MOUTH ONCE AS NEEDED FOR MIGRAINE. MAY REPEAT IN 2 HOURS IF HEADACHE PERSISTS OR RECURS. 9 tablet 11  . naproxen sodium (ANAPROX DS) 550 MG tablet Take 1 tablet (550 mg total) by mouth 2 (two) times daily with a meal. (Patient not taking: Reported on 11/24/2018) 60 tablet 1  . ondansetron (ZOFRAN ODT) 4 MG disintegrating tablet Take 1 tablet (4 mg total) by mouth every 6 (six) hours as needed  for nausea. (Patient not taking: Reported on 11/24/2018) 20 tablet 0   No current facility-administered medications on file prior to visit.      Review of Systems:  All pertinent positive/negative included in HPI, all other review of systems are negative   Objective:  Physical Exam BP 116/76   Pulse 72   Wt 143 lb 6.4 oz (65 kg)   BMI 23.15 kg/m  CONSTITUTIONAL: Well-developed, well-nourished female in no acute distress.  EYES: EOM intact ENT: Normocephalic CARDIOVASCULAR: Regular rate  RESPIRATORY: Normal rate.  MUSCULOSKELETAL: Normal ROM SKIN: Warm, dry without erythema  NEUROLOGICAL: Alert, oriented, CN II-XII grossly intact, Appropriate balance PSYCH: Normal behavior, mood   Assessment & Plan:  Assessment: 1. Headache associated with hormonal factors   2. Migraine without aura and without status migrainosus, not intractable    Improving  Plan: Pt doing great on current regimen.  Meds refilled.  Healthy lifestyle applauded and encouraged.   Will plan to call pt  when Nurtek available as I suspect it will be helpful to her without the negative side effects experienced by triptan.   Pt may RTC 1 year.     Paticia Stack, PA-C 11/24/2018 8:12 AM

## 2019-03-09 DIAGNOSIS — Z0000 Encounter for general adult medical examination without abnormal findings: Secondary | ICD-10-CM | POA: Diagnosis not present

## 2019-03-09 DIAGNOSIS — E7800 Pure hypercholesterolemia, unspecified: Secondary | ICD-10-CM | POA: Diagnosis not present

## 2019-03-12 DIAGNOSIS — G43109 Migraine with aura, not intractable, without status migrainosus: Secondary | ICD-10-CM | POA: Diagnosis not present

## 2019-03-12 DIAGNOSIS — Z0000 Encounter for general adult medical examination without abnormal findings: Secondary | ICD-10-CM | POA: Diagnosis not present

## 2019-03-12 DIAGNOSIS — E7800 Pure hypercholesterolemia, unspecified: Secondary | ICD-10-CM | POA: Diagnosis not present

## 2019-03-12 MED FILL — SUMAtriptan SUCCINATE 100 M: 100 | 30 days supply | Qty: 9 | Fill #0

## 2019-04-05 ENCOUNTER — Other Ambulatory Visit: Payer: Self-pay

## 2019-04-05 ENCOUNTER — Encounter: Payer: Self-pay | Admitting: Obstetrics & Gynecology

## 2019-04-05 ENCOUNTER — Ambulatory Visit (INDEPENDENT_AMBULATORY_CARE_PROVIDER_SITE_OTHER): Payer: 59 | Admitting: Obstetrics & Gynecology

## 2019-04-05 VITALS — BP 129/75 | HR 72 | Wt 139.0 lb

## 2019-04-05 DIAGNOSIS — Z1151 Encounter for screening for human papillomavirus (HPV): Secondary | ICD-10-CM | POA: Diagnosis not present

## 2019-04-05 DIAGNOSIS — Z1231 Encounter for screening mammogram for malignant neoplasm of breast: Secondary | ICD-10-CM

## 2019-04-05 DIAGNOSIS — Z01419 Encounter for gynecological examination (general) (routine) without abnormal findings: Secondary | ICD-10-CM

## 2019-04-05 DIAGNOSIS — Z124 Encounter for screening for malignant neoplasm of cervix: Secondary | ICD-10-CM

## 2019-04-05 NOTE — Patient Instructions (Signed)

## 2019-04-05 NOTE — Progress Notes (Signed)
GYNECOLOGY ANNUAL PREVENTATIVE CARE ENCOUNTER NOTE  History:     Rachael King is a 43 y.o. G36P2002 female here for a routine annual gynecologic exam.  Current complaints: none.   Denies abnormal vaginal bleeding, discharge, pelvic pain, problems with intercourse or other gynecologic concerns.    Gynecologic History Patient's last menstrual period was 04/01/2019. Contraception: vasectomy Last Pap: 03/06/2018. Results were: normal with negative HPV Last mammogram: 03/13/2018. Results were: normal  Obstetric History OB History  Gravida Para Term Preterm AB Living  2 2 2  0 0 2  SAB TAB Ectopic Multiple Live Births  0 0 0 0 2    # Outcome Date GA Lbr Len/2nd Weight Sex Delivery Anes PTL Lv  2 Term 09/23/11 [id number] / 00:29 7 lb 3.3 oz (3.27 kg) F Vag-Spont EPI  LIV  1 Term     F Vag-Spont EPI N LIV    Past Medical History:  Diagnosis Date  . No pertinent past medical history     Past Surgical History:  Procedure Laterality Date  . BUNIONECTOMY Left 01/30/2016   [place] bunionectomy with screw fixation left foot second metatarsal osteotomy with double screw fixation left foot.  .Marland KitchenMANDIBLE RECONSTRUCTION    . WISDOM TOOTH EXTRACTION      Current Outpatient Medications on File Prior to Visit  Medication Sig Dispense Refill  . calcium carbonate (TUMS - DOSED IN MG ELEMENTAL CALCIUM) 500 MG chewable tablet Chew 2 tablets by mouth daily.    . Coenzyme Q10 (COQ10) 200 MG CAPS     . Multiple Vitamin (MULTIVITAMIN) tablet Take 1 tablet by mouth daily.    . SUMAtriptan (IMITREX) 100 MG tablet TAKE 1 TABLET BY MOUTH ONCE AS NEEDED FOR MIGRAINE. MAY REPEAT IN 2 HOURS IF HEADACHE PERSISTS OR RECURS. 9 tablet 11  . metoCLOPramide (REGLAN) 10 MG tablet Take by mouth.    . naproxen sodium (ANAPROX DS) 550 MG tablet Take 1 tablet (550 mg total) by mouth 2 (two) times daily with a meal. (Patient not taking: Reported on 11/24/2018) 60 tablet 1  . ondansetron (ZOFRAN ODT) 4 MG  disintegrating tablet Take 1 tablet (4 mg total) by mouth every 6 (six) hours as needed for nausea. (Patient not taking: Reported on 11/24/2018) 20 tablet 0  . Red Yeast Rice 600 MG TABS Take by mouth.     No current facility-administered medications on file prior to visit.     No Known Allergies  Social History:  reports that she has never smoked. She has never used smokeless tobacco. She reports current alcohol use. She reports that she does not use drugs.  Family History  Problem Relation Age of Onset  . Heart disease Father        A FIB  . Hypertension Mother        OPEN HEART SURGERY    The following portions of the patient's history were reviewed and updated as appropriate: allergies, current medications, past family history, past medical history, past social history, past surgical history and problem list.  Review of Systems Pertinent items noted in HPI and remainder of comprehensive ROS otherwise negative.  Physical Exam:  BP 129/75   Pulse 72   Wt 139 lb (63 kg)   LMP 04/01/2019   BMI 22.44 kg/m  CONSTITUTIONAL: Well-developed, well-nourished female in no acute distress.  HENT:  Normocephalic, atraumatic, External right and left ear normal. Oropharynx is clear and moist EYES: Conjunctivae and EOM are normal. Pupils are equal,  round, and reactive to light. No scleral icterus.  NECK: Normal range of motion, supple, no masses.  Normal thyroid.  SKIN: Skin is warm and dry. No rash noted. Not diaphoretic. No erythema. No pallor. MUSCULOSKELETAL: Normal range of motion. No tenderness.  No cyanosis, clubbing, or edema.  2+ distal pulses. NEUROLOGIC: Alert and oriented to person, place, and time. Normal reflexes, muscle tone coordination. No cranial nerve deficit noted. PSYCHIATRIC: Normal mood and affect. Normal behavior. Normal judgment and thought content. CARDIOVASCULAR: Normal heart rate noted, regular rhythm RESPIRATORY: Clear to auscultation bilaterally. Effort and  breath sounds normal, no problems with respiration noted. BREASTS: Symmetric in size. No masses, skin changes, nipple drainage, or lymphadenopathy. ABDOMEN: Soft, normal bowel sounds, no distention noted.  No tenderness, rebound or guarding.  PELVIC: Normal appearing external genitalia; normal appearing vaginal mucosa and cervix.  No abnormal discharge noted.  Pap smear obtained.  Normal uterine size, no other palpable masses, no uterine or adnexal tenderness.   Assessment and Plan:    1. Encounter for screening mammogram for breast cancer - MM 3D SCREEN BREAST BILATERAL; Future Mammogram scheduled  2. Well woman exam with routine gynecological exam - Cytology - PAP Will follow up results of pap smear and manage accordingly. Routine preventative health maintenance measures emphasized. Please refer to After Visit Summary for other counseling recommendations.      Verita Schneiders, MD, [place] for [employer] [facility], [place]

## 2019-04-10 LAB — CYTOLOGY - PAP
Diagnosis: NEGATIVE
HPV: NOT DETECTED

## 2019-04-11 ENCOUNTER — Ambulatory Visit (HOSPITAL_COMMUNITY): Payer: 59

## 2019-04-13 ENCOUNTER — Other Ambulatory Visit: Payer: Self-pay

## 2019-04-13 ENCOUNTER — Ambulatory Visit (HOSPITAL_COMMUNITY)
Admission: RE | Admit: 2019-04-13 | Discharge: 2019-04-13 | Disposition: A | Discharge status: Home / Self Care | Payer: 59 | Source: Ambulatory Visit | Attending: Obstetrics & Gynecology | Admitting: Obstetrics & Gynecology

## 2019-04-13 DIAGNOSIS — Z1231 Encounter for screening mammogram for malignant neoplasm of breast: Secondary | ICD-10-CM | POA: Insufficient documentation

## 2019-05-22 DIAGNOSIS — D225 Melanocytic nevi of trunk: Secondary | ICD-10-CM | POA: Diagnosis not present

## 2019-05-22 DIAGNOSIS — D2262 Melanocytic nevi of left upper limb, including shoulder: Secondary | ICD-10-CM | POA: Diagnosis not present

## 2019-05-22 DIAGNOSIS — D2239 Melanocytic nevi of other parts of face: Secondary | ICD-10-CM | POA: Diagnosis not present

## 2019-05-22 DIAGNOSIS — L720 Epidermal cyst: Secondary | ICD-10-CM | POA: Diagnosis not present

## 2019-05-22 DIAGNOSIS — D2261 Melanocytic nevi of right upper limb, including shoulder: Secondary | ICD-10-CM | POA: Diagnosis not present

## 2019-05-22 DIAGNOSIS — D1801 Hemangioma of skin and subcutaneous tissue: Secondary | ICD-10-CM | POA: Diagnosis not present

## 2019-05-22 DIAGNOSIS — D2272 Melanocytic nevi of left lower limb, including hip: Secondary | ICD-10-CM | POA: Diagnosis not present

## 2019-05-22 DIAGNOSIS — L819 Disorder of pigmentation, unspecified: Secondary | ICD-10-CM | POA: Diagnosis not present

## 2019-07-26 DIAGNOSIS — H5213 Myopia, bilateral: Secondary | ICD-10-CM | POA: Diagnosis not present

## 2019-10-18 MED FILL — SUMAtriptan SUCCINATE 100 M: 100 | 30 days supply | Qty: 9 | Fill #1

## 2020-03-10 DIAGNOSIS — Z0000 Encounter for general adult medical examination without abnormal findings: Secondary | ICD-10-CM | POA: Diagnosis not present

## 2020-03-12 DIAGNOSIS — E7800 Pure hypercholesterolemia, unspecified: Secondary | ICD-10-CM | POA: Diagnosis not present

## 2020-03-12 DIAGNOSIS — Z0000 Encounter for general adult medical examination without abnormal findings: Secondary | ICD-10-CM | POA: Diagnosis not present

## 2020-04-22 ENCOUNTER — Ambulatory Visit (INDEPENDENT_AMBULATORY_CARE_PROVIDER_SITE_OTHER): Payer: 59 | Admitting: Obstetrics & Gynecology

## 2020-04-22 ENCOUNTER — Other Ambulatory Visit: Payer: Self-pay

## 2020-04-22 ENCOUNTER — Encounter: Payer: Self-pay | Admitting: Obstetrics & Gynecology

## 2020-04-22 ENCOUNTER — Other Ambulatory Visit (HOSPITAL_COMMUNITY)
Admission: RE | Admit: 2020-04-22 | Discharge: 2020-04-22 | Disposition: A | Discharge status: Home / Self Care | Payer: 59 | Source: Ambulatory Visit | Attending: Obstetrics & Gynecology | Admitting: Obstetrics & Gynecology

## 2020-04-22 VITALS — BP 122/84 | HR 72 | Wt 148.2 lb

## 2020-04-22 DIAGNOSIS — Z01419 Encounter for gynecological examination (general) (routine) without abnormal findings: Secondary | ICD-10-CM | POA: Insufficient documentation

## 2020-04-22 DIAGNOSIS — Z1231 Encounter for screening mammogram for malignant neoplasm of breast: Secondary | ICD-10-CM

## 2020-04-22 DIAGNOSIS — G43009 Migraine without aura, not intractable, without status migrainosus: Secondary | ICD-10-CM

## 2020-04-22 MED ORDER — SUMATRIPTAN SUCCINATE 100 MG PO TABS: ORAL_TABLET | ORAL | 11 refills | Status: DC

## 2020-04-22 NOTE — Progress Notes (Signed)
GYNECOLOGY ANNUAL PREVENTATIVE CARE ENCOUNTER NOTE  History:     Rachael King is a 44 y.o. G55P2002 female here for a routine annual gynecologic exam.  Current complaints: none. Desires Imitrex refill.   Denies abnormal vaginal bleeding, discharge, pelvic pain, problems with intercourse or other gynecologic concerns.    Gynecologic History Patient's last menstrual period was 04/14/2020. Contraception: vasectomy Last Pap: 04/05/2019. Results were: normal with negative HPV Last mammogram: 04/13/2019. Results were: normal  Obstetric History OB History  Gravida Para Term Preterm AB Living  2 2 2  0 0 2  SAB TAB Ectopic Multiple Live Births  0 0 0 0 2    # Outcome Date GA Lbr Len/2nd Weight Sex Delivery Anes PTL Lv  2 Term 09/23/11 [id number]:20 / 00:29 7 lb 3.3 oz (3.27 kg) F Vag-Spont EPI  LIV  1 Term     F Vag-Spont EPI N LIV    Past Medical History:  Diagnosis Date  . No pertinent past medical history     Past Surgical History:  Procedure Laterality Date  . BUNIONECTOMY Left 01/30/2016   [place] bunionectomy with screw fixation left foot second metatarsal osteotomy with double screw fixation left foot.  .Marland KitchenMANDIBLE RECONSTRUCTION    . WISDOM TOOTH EXTRACTION      Current Outpatient Medications on File Prior to Visit  Medication Sig Dispense Refill  . calcium carbonate (TUMS - DOSED IN MG ELEMENTAL CALCIUM) 500 MG chewable tablet Chew 2 tablets by mouth daily.    . Coenzyme Q10 (COQ10) 200 MG CAPS     . Multiple Vitamin (MULTIVITAMIN) tablet Take 1 tablet by mouth daily.    . Red Yeast Rice 600 MG TABS Take by mouth.    . metoCLOPramide (REGLAN) 10 MG tablet Take by mouth.    . naproxen sodium (ANAPROX DS) 550 MG tablet Take 1 tablet (550 mg total) by mouth 2 (two) times daily with a meal. (Patient not taking: Reported on 11/24/2018) 60 tablet 1  . ondansetron (ZOFRAN ODT) 4 MG disintegrating tablet Take 1 tablet (4 mg total) by mouth every 6 (six) hours as needed for  nausea. (Patient not taking: Reported on 11/24/2018) 20 tablet 0   No current facility-administered medications on file prior to visit.    No Known Allergies  Social History:  reports that she has never smoked. She has never used smokeless tobacco. She reports current alcohol use. She reports that she does not use drugs.  Family History  Problem Relation Age of Onset  . Heart disease Father        A FIB  . Hypertension Mother        OPEN HEART SURGERY    The following portions of the patient's history were reviewed and updated as appropriate: allergies, current medications, past family history, past medical history, past social history, past surgical history and problem list.  Review of Systems Pertinent items noted in HPI and remainder of comprehensive ROS otherwise negative.  Physical Exam:  BP 122/84   Pulse 72   Wt 148 lb 3.2 oz (67.2 kg)   LMP 04/14/2020   BMI 23.92 kg/m  CONSTITUTIONAL: Well-developed, well-nourished female in no acute distress.  HENT:  Normocephalic, atraumatic, External right and left ear normal. Oropharynx is clear and moist EYES: Conjunctivae and EOM are normal. Pupils are equal, round, and reactive to light. No scleral icterus.  NECK: Normal range of motion, supple, no masses.  Normal thyroid.  SKIN: Skin is warm and  dry. No rash noted. Not diaphoretic. No erythema. No pallor. MUSCULOSKELETAL: Normal range of motion. No tenderness.  No cyanosis, clubbing, or edema.  2+ distal pulses. NEUROLOGIC: Alert and oriented to person, place, and time. Normal reflexes, muscle tone coordination.  PSYCHIATRIC: Normal mood and affect. Normal behavior. Normal judgment and thought content. CARDIOVASCULAR: Normal heart rate noted, regular rhythm RESPIRATORY: Clear to auscultation bilaterally. Effort and breath sounds normal, no problems with respiration noted. BREASTS: Symmetric in size. No masses, tenderness, skin changes, nipple drainage, or lymphadenopathy  bilaterally. Performed in the presence of a chaperone. ABDOMEN: Soft, no distention noted.  No tenderness, rebound or guarding.  PELVIC: Normal appearing external genitalia and urethral meatus; normal appearing vaginal mucosa and cervix.  Bloody discharge noted, at end of period.  Pap smear obtained.  Normal uterine size, no other palpable masses, no uterine or adnexal tenderness.  Performed in the presence of a chaperone.   Assessment and Plan:      1. Migraine without aura and without status migrainosus, not intractable Imitrex refilled - SUMAtriptan (IMITREX) 100 MG tablet; TAKE 1 TABLET BY MOUTH ONCE AS NEEDED FOR MIGRAINE. MAY REPEAT IN 2 HOURS IF HEADACHE PERSISTS OR RECURS.  Dispense: 9 tablet; Refill: 11  2. Encounter for screening mammogram for breast cancer - MM 3D SCREEN BREAST BILATERAL; Future scheduled  3. Well woman exam with routine gynecological exam - Cytology - PAP( [place]) Will follow up results of pap smear and manage accordingly. Routine preventative health maintenance measures emphasized. Please refer to After Visit Summary for other counseling recommendations.      Verita Schneiders, MD, [place] for [facility], [place]

## 2020-04-22 NOTE — Patient Instructions (Signed)

## 2020-04-24 DIAGNOSIS — Z01419 Encounter for gynecological examination (general) (routine) without abnormal findings: Secondary | ICD-10-CM | POA: Diagnosis not present

## 2020-04-24 DIAGNOSIS — G43009 Migraine without aura, not intractable, without status migrainosus: Secondary | ICD-10-CM | POA: Diagnosis not present

## 2020-04-24 DIAGNOSIS — Z1231 Encounter for screening mammogram for malignant neoplasm of breast: Secondary | ICD-10-CM | POA: Diagnosis not present

## 2020-04-25 LAB — CYTOLOGY - PAP
Adequacy: ABNORMAL
Comment: NEGATIVE

## 2020-04-28 ENCOUNTER — Other Ambulatory Visit: Payer: Self-pay | Admitting: *Deleted

## 2020-04-28 DIAGNOSIS — G43009 Migraine without aura, not intractable, without status migrainosus: Secondary | ICD-10-CM

## 2020-04-28 MED ORDER — SUMATRIPTAN SUCCINATE 100 MG PO TABS: ORAL_TABLET | ORAL | 11 refills | Status: DC

## 2020-04-28 MED FILL — SUMAtriptan SUCCINATE 100 M: 100 | 30 days supply | Qty: 9 | Fill #0

## 2020-04-28 NOTE — Progress Notes (Signed)
Pt sent message that pharmacy did not receive the refill from her appt

## 2020-05-12 ENCOUNTER — Other Ambulatory Visit: Payer: Self-pay

## 2020-05-12 ENCOUNTER — Ambulatory Visit (HOSPITAL_COMMUNITY)
Admission: RE | Admit: 2020-05-12 | Discharge: 2020-05-12 | Disposition: A | Discharge status: Home / Self Care | Payer: 59 | Source: Ambulatory Visit | Attending: Obstetrics & Gynecology | Admitting: Obstetrics & Gynecology

## 2020-05-12 DIAGNOSIS — Z1231 Encounter for screening mammogram for malignant neoplasm of breast: Secondary | ICD-10-CM | POA: Diagnosis not present

## 2020-05-22 ENCOUNTER — Encounter: Payer: Self-pay | Admitting: Family Medicine

## 2020-05-22 ENCOUNTER — Ambulatory Visit (INDEPENDENT_AMBULATORY_CARE_PROVIDER_SITE_OTHER): Payer: 59 | Admitting: Family Medicine

## 2020-05-22 ENCOUNTER — Other Ambulatory Visit: Payer: Self-pay

## 2020-05-22 ENCOUNTER — Other Ambulatory Visit (HOSPITAL_COMMUNITY)
Admission: RE | Admit: 2020-05-22 | Discharge: 2020-05-22 | Disposition: A | Discharge status: Home / Self Care | Payer: 59 | Source: Ambulatory Visit | Attending: Family Medicine | Admitting: Family Medicine

## 2020-05-22 VITALS — BP 128/78 | HR 78 | Ht 66.0 in | Wt 146.0 lb

## 2020-05-22 DIAGNOSIS — Z124 Encounter for screening for malignant neoplasm of cervix: Secondary | ICD-10-CM | POA: Insufficient documentation

## 2020-05-22 DIAGNOSIS — Z23 Encounter for immunization: Secondary | ICD-10-CM

## 2020-05-22 NOTE — Progress Notes (Signed)
° °  Subjective:    Patient ID: Rachael King is a 44 y.o. female presenting with repeat pap  on 05/22/2020  HPI: Here for repeat pap smear. She had a non-diagnostic pap. Wants flu shot today.  Review of Systems  Constitutional: Negative for chills and fever.  Respiratory: Negative for shortness of breath.   Cardiovascular: Negative for chest pain.  Gastrointestinal: Negative for abdominal pain, nausea and vomiting.  Genitourinary: Negative for dysuria.  Skin: Negative for rash.      Objective:    BP 128/78    Pulse 78    Ht 5' 6"  (1.676 m)    Wt 146 lb (66.2 kg)    LMP 05/11/2020 (Approximate)    BMI 23.57 kg/m  Physical Exam Constitutional:      General: She is not in acute distress.    Appearance: She is well-developed.  HENT:     Head: Normocephalic and atraumatic.  Eyes:     General: No scleral icterus. Cardiovascular:     Rate and Rhythm: Normal rate.  Pulmonary:     Effort: Pulmonary effort is normal.  Abdominal:     Palpations: Abdomen is soft.  Genitourinary:    Comments: BUS normal, vagina is pink and rugated, cervix is parous without lesion  Musculoskeletal:     Cervical back: Neck supple.  Skin:    General: Skin is warm and dry.  Neurological:     Mental Status: She is alert and oriented to person, place, and time.         Assessment & Plan:  Screening for cervical cancer - Plan: Cytology - PAP  Need for influenza vaccination - Plan: Flu Vaccine QUAD 36+ mos IM (Fluarix, Quad PF)   Return if symptoms worsen or fail to improve.  Donnamae Jude 05/22/2020 3:16 PM

## 2020-05-26 LAB — CYTOLOGY - PAP
Comment: NEGATIVE
Diagnosis: NEGATIVE
High risk HPV: NEGATIVE

## 2020-05-28 DIAGNOSIS — D2272 Melanocytic nevi of left lower limb, including hip: Secondary | ICD-10-CM | POA: Diagnosis not present

## 2020-05-28 DIAGNOSIS — D2261 Melanocytic nevi of right upper limb, including shoulder: Secondary | ICD-10-CM | POA: Diagnosis not present

## 2020-05-28 DIAGNOSIS — L821 Other seborrheic keratosis: Secondary | ICD-10-CM | POA: Diagnosis not present

## 2020-05-28 DIAGNOSIS — L819 Disorder of pigmentation, unspecified: Secondary | ICD-10-CM | POA: Diagnosis not present

## 2020-05-28 DIAGNOSIS — D2271 Melanocytic nevi of right lower limb, including hip: Secondary | ICD-10-CM | POA: Diagnosis not present

## 2020-05-28 DIAGNOSIS — D2239 Melanocytic nevi of other parts of face: Secondary | ICD-10-CM | POA: Diagnosis not present

## 2020-05-28 DIAGNOSIS — D2262 Melanocytic nevi of left upper limb, including shoulder: Secondary | ICD-10-CM | POA: Diagnosis not present

## 2020-05-28 DIAGNOSIS — D225 Melanocytic nevi of trunk: Secondary | ICD-10-CM | POA: Diagnosis not present

## 2020-05-28 DIAGNOSIS — D1801 Hemangioma of skin and subcutaneous tissue: Secondary | ICD-10-CM | POA: Diagnosis not present

## 2020-07-30 DIAGNOSIS — Z20822 Contact with and (suspected) exposure to covid-19: Secondary | ICD-10-CM | POA: Diagnosis not present

## 2020-08-19 DIAGNOSIS — H5213 Myopia, bilateral: Secondary | ICD-10-CM | POA: Diagnosis not present

## 2020-08-19 DIAGNOSIS — H52222 Regular astigmatism, left eye: Secondary | ICD-10-CM | POA: Diagnosis not present

## 2020-09-11 MED FILL — SUMAtriptan SUCCINATE 100 M: 100 | 30 days supply | Qty: 9 | Fill #1

## 2020-12-23 ENCOUNTER — Other Ambulatory Visit (HOSPITAL_COMMUNITY): Payer: Self-pay

## 2020-12-26 ENCOUNTER — Other Ambulatory Visit (HOSPITAL_COMMUNITY): Payer: Self-pay

## 2021-03-13 DIAGNOSIS — G43109 Migraine with aura, not intractable, without status migrainosus: Secondary | ICD-10-CM | POA: Diagnosis not present

## 2021-03-13 DIAGNOSIS — Z8249 Family history of ischemic heart disease and other diseases of the circulatory system: Secondary | ICD-10-CM | POA: Diagnosis not present

## 2021-03-13 DIAGNOSIS — Z87891 Personal history of nicotine dependence: Secondary | ICD-10-CM | POA: Diagnosis not present

## 2021-03-13 DIAGNOSIS — Z79899 Other long term (current) drug therapy: Secondary | ICD-10-CM | POA: Diagnosis not present

## 2021-03-13 DIAGNOSIS — E7800 Pure hypercholesterolemia, unspecified: Secondary | ICD-10-CM | POA: Diagnosis not present

## 2021-03-13 DIAGNOSIS — Z0000 Encounter for general adult medical examination without abnormal findings: Secondary | ICD-10-CM | POA: Diagnosis not present

## 2021-03-19 ENCOUNTER — Other Ambulatory Visit (HOSPITAL_COMMUNITY): Payer: Self-pay

## 2021-03-19 DIAGNOSIS — G43109 Migraine with aura, not intractable, without status migrainosus: Secondary | ICD-10-CM | POA: Diagnosis not present

## 2021-03-19 DIAGNOSIS — Z0000 Encounter for general adult medical examination without abnormal findings: Secondary | ICD-10-CM | POA: Diagnosis not present

## 2021-03-19 MED ORDER — UBRELVY 50 MG PO TABS
ORAL_TABLET | ORAL | 11 refills | Status: DC
  Filled 2021-03-19 – 2021-03-25 (×2): qty 9, 30d supply, fill #0
  Filled 2021-11-11: qty 9, 30d supply, fill #1
  Filled 2021-12-23: qty 9, 30d supply, fill #2
  Filled 2022-02-02 (×2): qty 9, 30d supply, fill #3
  Filled 2022-03-15: qty 9, 30d supply, fill #4

## 2021-03-25 ENCOUNTER — Other Ambulatory Visit (HOSPITAL_COMMUNITY): Payer: Self-pay

## 2021-05-11 ENCOUNTER — Encounter: Payer: Self-pay | Admitting: Obstetrics & Gynecology

## 2021-05-11 ENCOUNTER — Other Ambulatory Visit: Payer: Self-pay

## 2021-05-11 ENCOUNTER — Other Ambulatory Visit (HOSPITAL_COMMUNITY)
Admission: RE | Admit: 2021-05-11 | Discharge: 2021-05-11 | Disposition: A | Discharge status: Home / Self Care | Payer: 59 | Source: Ambulatory Visit | Attending: Obstetrics & Gynecology | Admitting: Obstetrics & Gynecology

## 2021-05-11 ENCOUNTER — Ambulatory Visit (INDEPENDENT_AMBULATORY_CARE_PROVIDER_SITE_OTHER): Payer: 59 | Admitting: Obstetrics & Gynecology

## 2021-05-11 VITALS — BP 127/73 | HR 79 | Ht 65.0 in | Wt 152.0 lb

## 2021-05-11 DIAGNOSIS — Z01419 Encounter for gynecological examination (general) (routine) without abnormal findings: Secondary | ICD-10-CM | POA: Diagnosis not present

## 2021-05-11 DIAGNOSIS — Z1231 Encounter for screening mammogram for malignant neoplasm of breast: Secondary | ICD-10-CM

## 2021-05-11 DIAGNOSIS — Z1211 Encounter for screening for malignant neoplasm of colon: Secondary | ICD-10-CM | POA: Diagnosis not present

## 2021-05-11 NOTE — Progress Notes (Signed)
GYNECOLOGY ANNUAL PREVENTATIVE CARE ENCOUNTER NOTE  History:     Rachael COCHRANE is a 45 y.o. G65P2002 female here for a routine annual gynecologic exam.  Current complaints: none.   Denies abnormal vaginal bleeding, discharge, pelvic pain, problems with intercourse or other gynecologic concerns.    Gynecologic History Patient's last menstrual period was 04/16/2021 (exact date). Contraception: vasectomy Last Pap: 04/22/2020. Result was normal with negative HPV Last Mammogram: 05/12/2020.  Result was normal  Obstetric History OB History  Gravida Para Term Preterm AB Living  2 2 2  0 0 2  SAB IAB Ectopic Multiple Live Births  0 0 0 0 2    # Outcome Date GA Lbr Len/2nd Weight Sex Delivery Anes PTL Lv  2 Term 09/23/11 [id number] / 00:29 7 lb 3.3 oz (3.27 kg) F Vag-Spont EPI  LIV  1 Term     F Vag-Spont EPI N LIV    Past Medical History:  Diagnosis Date   No pertinent past medical history     Past Surgical History:  Procedure Laterality Date   BUNIONECTOMY Left 01/30/2016   [place] bunionectomy with screw fixation left foot second metatarsal osteotomy with double screw fixation left foot.   MANDIBLE RECONSTRUCTION     WISDOM TOOTH EXTRACTION      Current Outpatient Medications on File Prior to Visit  Medication Sig Dispense Refill   calcium carbonate (TUMS - DOSED IN MG ELEMENTAL CALCIUM) 500 MG chewable tablet Chew 2 tablets by mouth daily.     Coenzyme Q10 (COQ10) 200 MG CAPS      Multiple Vitamin (MULTIVITAMIN) tablet Take 1 tablet by mouth daily.     Red Yeast Rice 600 MG TABS Take by mouth.     Ubrogepant (UBRELVY) 50 MG TABS Take 1 tablet with onset of migraine headache - repeat 1 time in 2 hours if needed 9 tablet 11   SUMAtriptan (IMITREX) 100 MG tablet TAKE 1 TABLET BY MOUTH ONCE AS NEEDED FOR MIGRAINE. MAY REPEAT IN 2 HOURS IF HEADACHE PERSISTS OR RECURS. 9 tablet 11   No current facility-administered medications on file prior to visit.    No Known  Allergies  Social History:  reports that she has never smoked. She has never used smokeless tobacco. She reports current alcohol use. She reports that she does not use drugs.  Family History  Problem Relation Age of Onset   Heart disease Father        A FIB   Hypertension Mother        OPEN HEART SURGERY    The following portions of the patient's history were reviewed and updated as appropriate: allergies, current medications, past family history, past medical history, past social history, past surgical history and problem list.  Review of Systems Pertinent items noted in HPI and remainder of comprehensive ROS otherwise negative.  Physical Exam:  BP 127/73   Pulse 79   Ht 5' 5"  (1.651 m)   Wt 152 lb (68.9 kg)   LMP 04/16/2021 (Exact Date)   BMI 25.29 kg/m  CONSTITUTIONAL: Well-developed, well-nourished female in no acute distress.  HENT:  Normocephalic, atraumatic, External right and left ear normal.  EYES: Conjunctivae and EOM are normal. Pupils are equal, round, and reactive to light. No scleral icterus.  NECK: Normal range of motion, supple, no masses.  Normal thyroid.  SKIN: Skin is warm and dry. No rash noted. Not diaphoretic. No erythema. No pallor. MUSCULOSKELETAL: Normal range of motion. No tenderness.  No  cyanosis, clubbing, or edema. NEUROLOGIC: Alert and oriented to person, place, and time. Normal reflexes, muscle tone coordination.  PSYCHIATRIC: Normal mood and affect. Normal behavior. Normal judgment and thought content. CARDIOVASCULAR: Normal heart rate noted, regular rhythm RESPIRATORY: Clear to auscultation bilaterally. Effort and breath sounds normal, no problems with respiration noted. BREASTS: Symmetric in size. No masses, tenderness, skin changes, nipple drainage, or lymphadenopathy bilaterally. Performed in the presence of a chaperone. ABDOMEN: Soft, no distention noted.  No tenderness, rebound or guarding.  PELVIC: Normal appearing external genitalia and  urethral meatus; normal appearing vaginal mucosa and cervix.  No abnormal vaginal discharge noted.  Pap smear obtained.  Normal uterine size, no other palpable masses, no uterine or adnexal tenderness.  Performed in the presence of a chaperone.   Assessment and Plan:    1. Encounter for screening mammogram for breast cancer Mammogram scheduled - MM 3D SCREEN BREAST BILATERAL; Future  2. Colon cancer screening Discussed Cologuard vs Colonoscopy details, she desires Cologuard. Ordere placed, kit will be mailed to patient.  - Cologuard; Future  3 Well woman exam with routine gynecological exam - Cytology - PAP Will follow up results of pap smear and manage accordingly. Routine preventative health maintenance measures emphasized. Please refer to After Visit Summary for other counseling recommendations.       Verita Schneiders, MD, [place] for [employer], [place]

## 2021-05-13 LAB — CYTOLOGY - PAP
Comment: NEGATIVE
Diagnosis: NEGATIVE
High risk HPV: NEGATIVE

## 2021-05-20 ENCOUNTER — Other Ambulatory Visit: Payer: Self-pay

## 2021-05-20 ENCOUNTER — Ambulatory Visit (HOSPITAL_COMMUNITY)
Admission: RE | Admit: 2021-05-20 | Discharge: 2021-05-20 | Disposition: A | Discharge status: Home / Self Care | Payer: 59 | Source: Ambulatory Visit | Attending: Obstetrics & Gynecology | Admitting: Obstetrics & Gynecology

## 2021-05-20 DIAGNOSIS — Z1231 Encounter for screening mammogram for malignant neoplasm of breast: Secondary | ICD-10-CM

## 2021-06-05 DIAGNOSIS — D225 Melanocytic nevi of trunk: Secondary | ICD-10-CM | POA: Diagnosis not present

## 2021-06-05 DIAGNOSIS — D485 Neoplasm of uncertain behavior of skin: Secondary | ICD-10-CM | POA: Diagnosis not present

## 2021-06-05 DIAGNOSIS — D2272 Melanocytic nevi of left lower limb, including hip: Secondary | ICD-10-CM | POA: Diagnosis not present

## 2021-06-05 DIAGNOSIS — D2262 Melanocytic nevi of left upper limb, including shoulder: Secondary | ICD-10-CM | POA: Diagnosis not present

## 2021-06-05 DIAGNOSIS — L821 Other seborrheic keratosis: Secondary | ICD-10-CM | POA: Diagnosis not present

## 2021-06-05 DIAGNOSIS — L819 Disorder of pigmentation, unspecified: Secondary | ICD-10-CM | POA: Diagnosis not present

## 2021-06-05 DIAGNOSIS — D1801 Hemangioma of skin and subcutaneous tissue: Secondary | ICD-10-CM | POA: Diagnosis not present

## 2021-06-05 DIAGNOSIS — D2271 Melanocytic nevi of right lower limb, including hip: Secondary | ICD-10-CM | POA: Diagnosis not present

## 2021-06-05 DIAGNOSIS — L820 Inflamed seborrheic keratosis: Secondary | ICD-10-CM | POA: Diagnosis not present

## 2021-06-05 DIAGNOSIS — D2239 Melanocytic nevi of other parts of face: Secondary | ICD-10-CM | POA: Diagnosis not present

## 2021-06-11 ENCOUNTER — Other Ambulatory Visit: Payer: Self-pay | Admitting: *Deleted

## 2021-06-11 DIAGNOSIS — Z1211 Encounter for screening for malignant neoplasm of colon: Secondary | ICD-10-CM

## 2021-06-17 DIAGNOSIS — D485 Neoplasm of uncertain behavior of skin: Secondary | ICD-10-CM | POA: Diagnosis not present

## 2021-06-17 DIAGNOSIS — L988 Other specified disorders of the skin and subcutaneous tissue: Secondary | ICD-10-CM | POA: Diagnosis not present

## 2021-07-29 DIAGNOSIS — Z1211 Encounter for screening for malignant neoplasm of colon: Secondary | ICD-10-CM | POA: Diagnosis not present

## 2021-08-05 LAB — COLOGUARD: COLOGUARD: NEGATIVE

## 2021-09-01 DIAGNOSIS — H5213 Myopia, bilateral: Secondary | ICD-10-CM | POA: Diagnosis not present

## 2021-11-11 ENCOUNTER — Other Ambulatory Visit (HOSPITAL_COMMUNITY): Payer: Self-pay

## 2021-12-24 ENCOUNTER — Other Ambulatory Visit (HOSPITAL_COMMUNITY): Payer: Self-pay

## 2022-02-02 ENCOUNTER — Other Ambulatory Visit (HOSPITAL_COMMUNITY): Payer: Self-pay

## 2022-03-15 ENCOUNTER — Other Ambulatory Visit (HOSPITAL_COMMUNITY): Payer: Self-pay

## 2022-03-18 ENCOUNTER — Other Ambulatory Visit (HOSPITAL_COMMUNITY): Payer: Self-pay

## 2022-03-29 DIAGNOSIS — Z0000 Encounter for general adult medical examination without abnormal findings: Secondary | ICD-10-CM | POA: Diagnosis not present

## 2022-03-29 DIAGNOSIS — G43009 Migraine without aura, not intractable, without status migrainosus: Secondary | ICD-10-CM | POA: Diagnosis not present

## 2022-03-29 DIAGNOSIS — Z13220 Encounter for screening for lipoid disorders: Secondary | ICD-10-CM | POA: Diagnosis not present

## 2022-03-29 DIAGNOSIS — Z130 Encounter for screening for diseases of the blood and blood-forming organs and certain disorders involving the immune mechanism: Secondary | ICD-10-CM | POA: Diagnosis not present

## 2022-03-29 DIAGNOSIS — Z13228 Encounter for screening for other metabolic disorders: Secondary | ICD-10-CM | POA: Diagnosis not present

## 2022-03-29 DIAGNOSIS — Z1329 Encounter for screening for other suspected endocrine disorder: Secondary | ICD-10-CM | POA: Diagnosis not present

## 2022-03-29 DIAGNOSIS — Z79899 Other long term (current) drug therapy: Secondary | ICD-10-CM | POA: Diagnosis not present

## 2022-03-29 DIAGNOSIS — E7800 Pure hypercholesterolemia, unspecified: Secondary | ICD-10-CM | POA: Diagnosis not present

## 2022-03-30 ENCOUNTER — Other Ambulatory Visit (HOSPITAL_COMMUNITY): Payer: Self-pay

## 2022-03-30 DIAGNOSIS — Z23 Encounter for immunization: Secondary | ICD-10-CM | POA: Diagnosis not present

## 2022-03-30 DIAGNOSIS — G43109 Migraine with aura, not intractable, without status migrainosus: Secondary | ICD-10-CM | POA: Diagnosis not present

## 2022-03-30 DIAGNOSIS — Z0000 Encounter for general adult medical examination without abnormal findings: Secondary | ICD-10-CM | POA: Diagnosis not present

## 2022-03-30 DIAGNOSIS — E7800 Pure hypercholesterolemia, unspecified: Secondary | ICD-10-CM | POA: Diagnosis not present

## 2022-03-30 MED ORDER — UBRELVY 50 MG PO TABS
50.0000 mg | ORAL_TABLET | ORAL | 11 refills | Status: DC
  Filled 2022-03-30 – 2022-07-08 (×2): qty 9, 30d supply, fill #0
  Filled 2022-08-24: qty 9, 30d supply, fill #1
  Filled 2022-11-09: qty 9, 30d supply, fill #2
  Filled 2023-01-10: qty 9, 30d supply, fill #3
  Filled 2023-02-25 – 2023-03-04 (×2): qty 9, 30d supply, fill #4
  Filled 2023-03-29: qty 9, 30d supply, fill #5

## 2022-04-20 ENCOUNTER — Encounter: Payer: Self-pay | Admitting: Obstetrics & Gynecology

## 2022-04-20 ENCOUNTER — Ambulatory Visit (INDEPENDENT_AMBULATORY_CARE_PROVIDER_SITE_OTHER): Payer: 59 | Admitting: Obstetrics & Gynecology

## 2022-04-20 ENCOUNTER — Other Ambulatory Visit (HOSPITAL_COMMUNITY)
Admission: RE | Admit: 2022-04-20 | Discharge: 2022-04-20 | Disposition: A | Discharge status: Home / Self Care | Payer: 59 | Source: Ambulatory Visit | Attending: Obstetrics & Gynecology | Admitting: Obstetrics & Gynecology

## 2022-04-20 VITALS — BP 118/81 | HR 80 | Ht 65.0 in | Wt 154.0 lb

## 2022-04-20 DIAGNOSIS — Z1231 Encounter for screening mammogram for malignant neoplasm of breast: Secondary | ICD-10-CM | POA: Diagnosis not present

## 2022-04-20 DIAGNOSIS — Z01419 Encounter for gynecological examination (general) (routine) without abnormal findings: Secondary | ICD-10-CM | POA: Diagnosis not present

## 2022-04-20 NOTE — Progress Notes (Signed)
GYNECOLOGY ANNUAL PREVENTATIVE CARE ENCOUNTER NOTE  History:     Rachael King is a 46 y.o. G40P2002 female here for a routine annual gynecologic exam.  Current complaints: none.   Denies abnormal vaginal bleeding, discharge, pelvic pain, problems with intercourse or other gynecologic concerns.    Gynecologic History Patient's last menstrual period was 04/18/2022 (approximate). Contraception: vasectomy Last Pap: 05/11/2021. Result was normal with negative HPV Last Mammogram: 05/21/2021.  Result was normal Last Colon Cancer Screening: 07/29/2021 Cologuard.  Result was normal  Obstetric History OB History  Gravida Para Term Preterm AB Living  '2 2 2 '$ 0 0 2  SAB IAB Ectopic Multiple Live Births  0 0 0 0 2    # Outcome Date GA Lbr Len/2nd Weight Sex Delivery Anes PTL Lv  2 Term 09/23/11 [id number]:20 / 00:29 7 lb 3.3 oz (3.27 kg) F Vag-Spont EPI  LIV  1 Term     F Vag-Spont EPI N LIV    Past Medical History:  Diagnosis Date   No pertinent past medical history     Past Surgical History:  Procedure Laterality Date   BUNIONECTOMY Left 01/30/2016   [place] bunionectomy with screw fixation left foot second metatarsal osteotomy with double screw fixation left foot.   MANDIBLE RECONSTRUCTION     WISDOM TOOTH EXTRACTION      Current Outpatient Medications on File Prior to Visit  Medication Sig Dispense Refill   Coenzyme Q10 (COQ10) 200 MG CAPS      Multiple Vitamin (MULTIVITAMIN) tablet Take 1 tablet by mouth daily.     Red Yeast Rice 600 MG TABS Take by mouth.     Ubrogepant (UBRELVY) 50 MG TABS Take 50 mg by mouth with the onset of migraine headache-repeat 1 tablet in 2 hours if needed 9 tablet 11   calcium carbonate (TUMS - DOSED IN MG ELEMENTAL CALCIUM) 500 MG chewable tablet Chew 2 tablets by mouth daily. (Patient not taking: Reported on 04/20/2022)     SUMAtriptan (IMITREX) 100 MG tablet TAKE 1 TABLET BY MOUTH ONCE AS NEEDED FOR MIGRAINE. MAY REPEAT IN 2 HOURS IF HEADACHE PERSISTS  OR RECURS. 9 tablet 11   No current facility-administered medications on file prior to visit.    No Known Allergies  Social History:  reports that she has never smoked. She has never used smokeless tobacco. She reports current alcohol use. She reports that she does not use drugs.  Family History  Problem Relation Age of Onset   Heart disease Father        A FIB   Hypertension Mother        OPEN HEART SURGERY    The following portions of the patient's history were reviewed and updated as appropriate: allergies, current medications, past family history, past medical history, past social history, past surgical history and problem list.  Review of Systems Pertinent items noted in HPI and remainder of comprehensive ROS otherwise negative.  Physical Exam:  BP 118/81   Pulse 80   Ht '5\' 5"'$  (1.651 m)   Wt 154 lb (69.9 kg)   LMP 04/18/2022 (Approximate)   BMI 25.63 kg/m  CONSTITUTIONAL: Well-developed, well-nourished female in no acute distress.  HENT:  Normocephalic, atraumatic, External right and left ear normal.  EYES: Conjunctivae and EOM are normal. Pupils are equal, round, and reactive to light. No scleral icterus.  NECK: Normal range of motion, supple, no masses.  Normal thyroid.  SKIN: Skin is warm and dry. No rash noted.  Not diaphoretic. No erythema. No pallor. MUSCULOSKELETAL: Normal range of motion. No tenderness.  No cyanosis, clubbing, or edema. NEUROLOGIC: Alert and oriented to person, place, and time. Normal reflexes, muscle tone coordination.  PSYCHIATRIC: Normal mood and affect. Normal behavior. Normal judgment and thought content. CARDIOVASCULAR: Normal heart rate noted, regular rhythm RESPIRATORY: Clear to auscultation bilaterally. Effort and breath sounds normal, no problems with respiration noted. BREASTS: Symmetric in size. No masses, tenderness, skin changes, nipple drainage, or lymphadenopathy bilaterally. Performed in the presence of a chaperone. ABDOMEN: Soft,  no distention noted.  No tenderness, rebound or guarding.  PELVIC: Normal appearing external genitalia and urethral meatus; normal appearing vaginal mucosa and cervix.  No abnormal vaginal discharge noted.  Pap smear obtained.  Normal uterine size, no other palpable masses, no uterine or adnexal tenderness.  Performed in the presence of a chaperone.   Assessment and Plan:     1. Encounter for screening mammogram for breast cancer Mammogram scheduled. - MM 3D SCREEN BREAST BILATERAL; Future  2. Well woman exam with routine gynecological exam - Cytology - PAP Will follow up results of pap smear and manage accordingly. Colon cancer screening is up to date. Routine preventative health maintenance measures emphasized. Please refer to After Visit Summary for other counseling recommendations.      Verita Schneiders, MD, [place] for [employer], [place]

## 2022-04-22 LAB — CYTOLOGY - PAP
Comment: NEGATIVE
Diagnosis: NEGATIVE
High risk HPV: NEGATIVE

## 2022-05-24 ENCOUNTER — Ambulatory Visit (HOSPITAL_COMMUNITY)
Admission: RE | Admit: 2022-05-24 | Discharge: 2022-05-24 | Disposition: A | Discharge status: Home / Self Care | Payer: 59 | Source: Ambulatory Visit | Attending: Obstetrics & Gynecology | Admitting: Obstetrics & Gynecology

## 2022-05-24 DIAGNOSIS — Z1231 Encounter for screening mammogram for malignant neoplasm of breast: Secondary | ICD-10-CM | POA: Diagnosis not present

## 2022-07-08 ENCOUNTER — Other Ambulatory Visit (HOSPITAL_COMMUNITY): Payer: Self-pay

## 2022-08-02 DIAGNOSIS — B070 Plantar wart: Secondary | ICD-10-CM | POA: Diagnosis not present

## 2022-08-02 DIAGNOSIS — D225 Melanocytic nevi of trunk: Secondary | ICD-10-CM | POA: Diagnosis not present

## 2022-08-02 DIAGNOSIS — D2239 Melanocytic nevi of other parts of face: Secondary | ICD-10-CM | POA: Diagnosis not present

## 2022-08-02 DIAGNOSIS — L819 Disorder of pigmentation, unspecified: Secondary | ICD-10-CM | POA: Diagnosis not present

## 2022-08-02 DIAGNOSIS — D2262 Melanocytic nevi of left upper limb, including shoulder: Secondary | ICD-10-CM | POA: Diagnosis not present

## 2022-08-02 DIAGNOSIS — L820 Inflamed seborrheic keratosis: Secondary | ICD-10-CM | POA: Diagnosis not present

## 2022-08-02 DIAGNOSIS — L821 Other seborrheic keratosis: Secondary | ICD-10-CM | POA: Diagnosis not present

## 2022-09-10 DIAGNOSIS — H5213 Myopia, bilateral: Secondary | ICD-10-CM | POA: Diagnosis not present

## 2022-09-30 DIAGNOSIS — E7800 Pure hypercholesterolemia, unspecified: Secondary | ICD-10-CM | POA: Diagnosis not present

## 2022-10-05 ENCOUNTER — Other Ambulatory Visit (HOSPITAL_COMMUNITY): Payer: Self-pay

## 2022-10-05 MED ORDER — ROSUVASTATIN CALCIUM 10 MG PO TABS
10.0000 mg | ORAL_TABLET | Freq: Every day | ORAL | 3 refills | Status: DC
  Filled 2022-10-05: qty 90, 90d supply, fill #0
  Filled 2023-01-27: qty 90, 90d supply, fill #1
  Filled 2023-05-06: qty 90, 90d supply, fill #2
  Filled 2023-08-04: qty 90, 90d supply, fill #3

## 2022-11-09 ENCOUNTER — Other Ambulatory Visit (HOSPITAL_COMMUNITY): Payer: Self-pay

## 2022-11-12 ENCOUNTER — Other Ambulatory Visit (HOSPITAL_COMMUNITY): Payer: Self-pay

## 2022-11-17 ENCOUNTER — Other Ambulatory Visit (HOSPITAL_COMMUNITY): Payer: Self-pay

## 2022-11-22 ENCOUNTER — Other Ambulatory Visit (HOSPITAL_COMMUNITY): Payer: Self-pay

## 2022-12-02 ENCOUNTER — Other Ambulatory Visit: Payer: Self-pay

## 2022-12-07 ENCOUNTER — Other Ambulatory Visit (HOSPITAL_COMMUNITY): Payer: Self-pay

## 2023-01-21 ENCOUNTER — Ambulatory Visit (INDEPENDENT_AMBULATORY_CARE_PROVIDER_SITE_OTHER): Payer: 59 | Admitting: Sports Medicine

## 2023-01-21 VITALS — BP 110/74 | Ht 65.0 in | Wt 153.0 lb

## 2023-01-21 DIAGNOSIS — M7062 Trochanteric bursitis, left hip: Secondary | ICD-10-CM | POA: Diagnosis not present

## 2023-01-21 NOTE — Progress Notes (Signed)
  Rachael King - 47 y.o. female MRN [id number]  Date of birth: 06-19-1976    CHIEF COMPLAINT:   Left hip pain    SUBJECTIVE:   HPI:  Pleasant 47 year old female comes to clinic to be evaluated for left hip pain.  Been hurting for about a month.  She reports a intermittent ache over the lateral aspect of the left hip.  It radiates down the outside of the leg to about the level of the knee.  It comes and goes.  She is tried some stretching which makes it feel better.  She has not tried any targeted therapies or take any medicine for it.  No numbness or tingling down the leg.  No weakness in the leg.  She works as an Acupuncturist at [employer]  ROS:     See HPI  PERTINENT  PMH / PSH FH / / SH:  Past Medical, Surgical, Social, and Family History Reviewed & Updated in the EMR.  Pertinent findings include:  none  OBJECTIVE: BP 110/74   Ht 5\' 5"  (1.651 m)   Wt 153 lb (69.4 kg)   BMI 25.46 kg/m   Physical Exam:  Vital signs are reviewed.  GEN: Alert and oriented, NAD Pulm: Breathing unlabored PSY: normal mood, congruent affect  MSK: Left hip -tender to palpation at the left greater trochanter.  Nontender palpation along the IT band distally.  Nontender at the ASIS or PSIS.  Full passive range of motion in hip flexion, internal rotation, and external rotation.  5/5 strength with resisted hip flexion.  4/5 strength with resisted hip abduction.  Negative logroll.  Negative FADIR/FABER.  Neurovascular intact distally  ASSESSMENT & PLAN:  1.  Left greater trochanteric pain syndrome  -Reassurance was provided the patient today that this is not an intra-articular hip problem.  Will start her on some home exercises focusing on hip abduction strengthening.  Encouraged ice and topical Voltaren on the greater trochanter as needed for pain.  She voiced understanding and agreed to the plan.  Should follow-up as needed.   Arvella Nigh, MD PGY-4, Sports Medicine Fellow [facility]  Addendum:  I was the preceptor for this visit and available for immediate consultation.  Norton Blizzard MD Marrianne Mood

## 2023-01-27 ENCOUNTER — Other Ambulatory Visit (HOSPITAL_COMMUNITY): Payer: Self-pay

## 2023-03-04 ENCOUNTER — Other Ambulatory Visit (HOSPITAL_COMMUNITY): Payer: Self-pay

## 2023-04-01 ENCOUNTER — Other Ambulatory Visit (HOSPITAL_COMMUNITY): Payer: Self-pay

## 2023-04-04 DIAGNOSIS — E7800 Pure hypercholesterolemia, unspecified: Secondary | ICD-10-CM | POA: Diagnosis not present

## 2023-04-04 DIAGNOSIS — Z0000 Encounter for general adult medical examination without abnormal findings: Secondary | ICD-10-CM | POA: Diagnosis not present

## 2023-04-05 ENCOUNTER — Other Ambulatory Visit: Payer: Self-pay

## 2023-04-06 ENCOUNTER — Other Ambulatory Visit (HOSPITAL_COMMUNITY): Payer: Self-pay

## 2023-04-06 DIAGNOSIS — G43009 Migraine without aura, not intractable, without status migrainosus: Secondary | ICD-10-CM | POA: Diagnosis not present

## 2023-04-06 DIAGNOSIS — Z0000 Encounter for general adult medical examination without abnormal findings: Secondary | ICD-10-CM | POA: Diagnosis not present

## 2023-04-06 DIAGNOSIS — E7800 Pure hypercholesterolemia, unspecified: Secondary | ICD-10-CM | POA: Diagnosis not present

## 2023-04-06 MED ORDER — ROSUVASTATIN CALCIUM 10 MG PO TABS
10.0000 mg | ORAL_TABLET | Freq: Every day | ORAL | 3 refills | Status: AC
  Filled 2023-04-06: qty 90, 90d supply, fill #0

## 2023-04-06 MED ORDER — UBRELVY 50 MG PO TABS
50.0000 mg | ORAL_TABLET | ORAL | 11 refills | Status: AC
  Filled 2023-04-06: qty 9, 30d supply, fill #0
  Filled 2023-06-22: qty 9, 30d supply, fill #1
  Filled 2023-08-04: qty 9, 30d supply, fill #2
  Filled 2023-09-06: qty 9, 30d supply, fill #3
  Filled 2023-10-14 (×2): qty 9, 30d supply, fill #4
  Filled 2023-12-05: qty 9, 30d supply, fill #5
  Filled 2024-02-13: qty 9, 30d supply, fill #6
  Filled 2024-03-11: qty 9, 30d supply, fill #7

## 2023-04-07 ENCOUNTER — Other Ambulatory Visit (HOSPITAL_COMMUNITY): Payer: Self-pay

## 2023-05-11 ENCOUNTER — Encounter: Payer: Self-pay | Admitting: Obstetrics & Gynecology

## 2023-05-11 ENCOUNTER — Ambulatory Visit: Payer: 59 | Admitting: Obstetrics & Gynecology

## 2023-05-11 ENCOUNTER — Other Ambulatory Visit (HOSPITAL_COMMUNITY)
Admission: RE | Admit: 2023-05-11 | Discharge: 2023-05-11 | Disposition: A | Discharge status: Home / Self Care | Payer: 59 | Source: Ambulatory Visit | Attending: Obstetrics & Gynecology | Admitting: Obstetrics & Gynecology

## 2023-05-11 VITALS — BP 146/82 | HR 82 | Ht 67.0 in | Wt 156.8 lb

## 2023-05-11 DIAGNOSIS — Z1231 Encounter for screening mammogram for malignant neoplasm of breast: Secondary | ICD-10-CM | POA: Diagnosis not present

## 2023-05-11 DIAGNOSIS — Z01419 Encounter for gynecological examination (general) (routine) without abnormal findings: Secondary | ICD-10-CM | POA: Insufficient documentation

## 2023-05-11 NOTE — Progress Notes (Signed)
GYNECOLOGY ANNUAL PREVENTATIVE CARE ENCOUNTER NOTE  History:     Rachael King is a 47 y.o. G52P2002 female here for a routine annual gynecologic exam.  Current complaints: none.   Denies abnormal vaginal bleeding, discharge, pelvic pain, problems with intercourse or other gynecologic concerns.    Gynecologic History Patient's last menstrual period was 04/23/2023. Contraception: vasectomy Last Pap: 04/20/2022. Result was normal with negative HPV Last Mammogram: 05/24/2022.  Result was normal Last Colon Cancer Screening: 07/29/2021 Cologuard.  Result was normal  Obstetric History OB History  Gravida Para Term Preterm AB Living  2 2 2  0 0 2  SAB IAB Ectopic Multiple Live Births  0 0 0 0 2    # Outcome Date GA Lbr Len/2nd Weight Sex Type Anes PTL Lv  2 Term 09/23/11 [redacted]w[redacted]d 17:20 / 00:29 7 lb 3.3 oz (3.27 kg) F Vag-Spont EPI  LIV  1 Term     F Vag-Spont EPI N LIV    Past Medical History:  Diagnosis Date   No pertinent past medical history     Past Surgical History:  Procedure Laterality Date   BUNIONECTOMY Left 01/30/2016   [place] bunionectomy with screw fixation left foot second metatarsal osteotomy with double screw fixation left foot.   MANDIBLE RECONSTRUCTION     WISDOM TOOTH EXTRACTION      Current Outpatient Medications on File Prior to Visit  Medication Sig Dispense Refill   Coenzyme Q10 (COQ10) 200 MG CAPS      Multiple Vitamin (MULTIVITAMIN) tablet Take 1 tablet by mouth daily.     rosuvastatin (CRESTOR) 10 MG tablet Take 1 tablet (10 mg total) by mouth daily. 90 tablet 3   rosuvastatin (CRESTOR) 10 MG tablet Take 1 tablet (10 mg total) by mouth daily. 90 tablet 3   Ubrogepant (UBRELVY) 50 MG TABS Take one pill with onset of migraine headache may repeat in 2 hours one time if needed for persistent migraine headache 9 tablet 11   No current facility-administered medications on file prior to visit.    No Known Allergies  Social History:  reports that she has  never smoked. She has never used smokeless tobacco. She reports current alcohol use. She reports that she does not use drugs.  Family History  Problem Relation Age of Onset   Heart disease Father        A FIB   Hypertension Mother        OPEN HEART SURGERY    The following portions of the patient's history were reviewed and updated as appropriate: allergies, current medications, past family history, past medical history, past social history, past surgical history and problem list.  Review of Systems Pertinent items noted in HPI and remainder of comprehensive ROS otherwise negative.  Physical Exam:  BP (!) 146/82   Pulse 82   Ht 5\' 7"  (1.702 m)   Wt 156 lb 12.8 oz (71.1 kg)   LMP 04/23/2023   BMI 24.56 kg/m  CONSTITUTIONAL: Well-developed, well-nourished female in no acute distress.  HENT:  Normocephalic, atraumatic, External right and left ear normal.  EYES: Conjunctivae and EOM are normal. Pupils are equal, round, and reactive to light. No scleral icterus.  NECK: Normal range of motion, supple, no masses.  Normal thyroid.  SKIN: Skin is warm and dry. No rash noted. Not diaphoretic. No erythema. No pallor. MUSCULOSKELETAL: Normal range of motion. No tenderness.  No cyanosis, clubbing, or edema. NEUROLOGIC: Alert and oriented to person, place, and time. Normal reflexes,  muscle tone coordination.  PSYCHIATRIC: Normal mood and affect. Normal behavior. Normal judgment and thought content. CARDIOVASCULAR: Normal heart rate noted, regular rhythm RESPIRATORY: Clear to auscultation bilaterally. Effort and breath sounds normal, no problems with respiration noted. BREASTS: Symmetric in size. No masses, tenderness, skin changes, nipple drainage, or lymphadenopathy bilaterally. Performed in the presence of a chaperone. ABDOMEN: Soft, no distention noted.  No tenderness, rebound or guarding.  PELVIC: Normal appearing external genitalia and urethral meatus; normal appearing vaginal mucosa and  cervix.  No abnormal vaginal discharge noted.  Pap smear obtained.  Normal uterine size, no other palpable masses, no uterine or adnexal tenderness.  Performed in the presence of a chaperone.   Assessment and Plan:     1. Encounter for screening mammogram for breast cancer Mammogram scheduled. - MM 3D SCREEN BREAST BILATERAL; Future  2. Well woman exam with routine gynecological exam - Cytology - PAP Will follow up results of pap smear and manage accordingly. Colon cancer screening is up to date. Routine preventative health maintenance measures emphasized. Please refer to After Visit Summary for other counseling recommendations.       Jaynie Collins, MD, FACOG Obstetrician & Gynecologist, [facility] for [employer], [facility]

## 2023-05-19 LAB — CYTOLOGY - PAP
Comment: NEGATIVE
Diagnosis: NEGATIVE
High risk HPV: NEGATIVE

## 2023-05-26 ENCOUNTER — Ambulatory Visit (HOSPITAL_COMMUNITY)
Admission: RE | Admit: 2023-05-26 | Discharge: 2023-05-26 | Disposition: A | Discharge status: Home / Self Care | Payer: 59 | Source: Ambulatory Visit | Attending: Obstetrics & Gynecology | Admitting: Obstetrics & Gynecology

## 2023-05-26 DIAGNOSIS — Z1231 Encounter for screening mammogram for malignant neoplasm of breast: Secondary | ICD-10-CM | POA: Insufficient documentation

## 2023-05-27 ENCOUNTER — Other Ambulatory Visit (HOSPITAL_COMMUNITY): Payer: Self-pay | Admitting: Obstetrics & Gynecology

## 2023-05-27 DIAGNOSIS — R928 Other abnormal and inconclusive findings on diagnostic imaging of breast: Secondary | ICD-10-CM

## 2023-05-31 ENCOUNTER — Encounter: Payer: Self-pay | Admitting: Obstetrics & Gynecology

## 2023-06-07 ENCOUNTER — Ambulatory Visit (HOSPITAL_COMMUNITY)
Admission: RE | Admit: 2023-06-07 | Discharge: 2023-06-07 | Disposition: A | Discharge status: Home / Self Care | Payer: 59 | Source: Ambulatory Visit | Attending: Obstetrics & Gynecology | Admitting: Obstetrics & Gynecology

## 2023-06-07 ENCOUNTER — Encounter (HOSPITAL_COMMUNITY): Payer: Self-pay

## 2023-06-07 DIAGNOSIS — R928 Other abnormal and inconclusive findings on diagnostic imaging of breast: Secondary | ICD-10-CM

## 2023-06-07 DIAGNOSIS — R92333 Mammographic heterogeneous density, bilateral breasts: Secondary | ICD-10-CM | POA: Diagnosis not present

## 2023-07-12 IMAGING — MG MM DIGITAL SCREENING BILAT W/ TOMO AND CAD
8 series · 9 of 24 positions shown · non-contrast
Comparison: Previous exam(s).

CLINICAL DATA: Screening.

EXAM:
DIGITAL SCREENING BILATERAL MAMMOGRAM WITH TOMOSYNTHESIS AND CAD
TECHNIQUE: Bilateral screening digital craniocaudal and mediolateral oblique
mammograms were obtained. Bilateral screening digital breast
tomosynthesis was performed. The images were evaluated with
computer-aided detection.

[R MLO synth-2D]
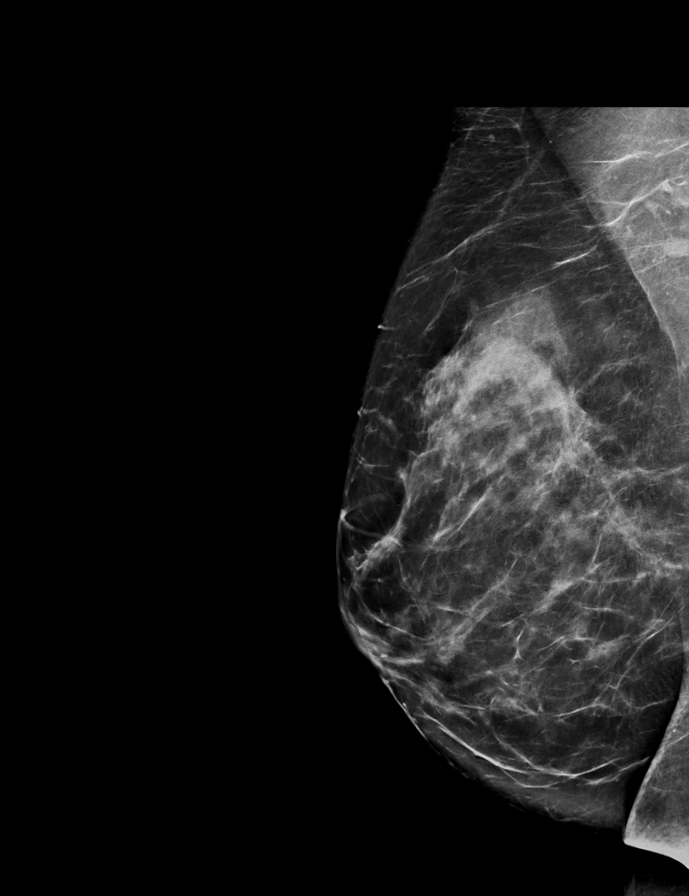

[R CC synth-2D]
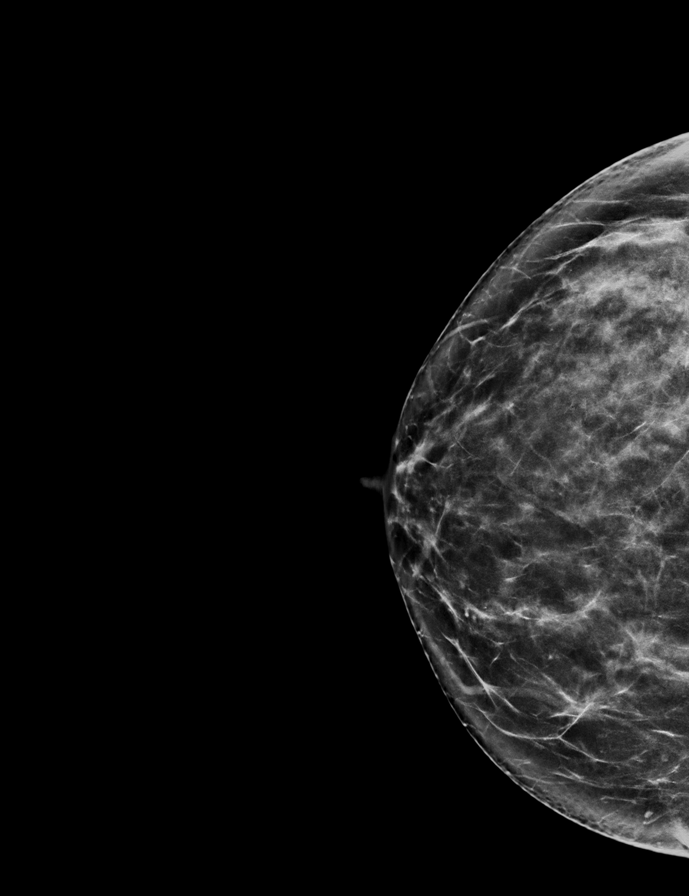

[L CC synth-2D]
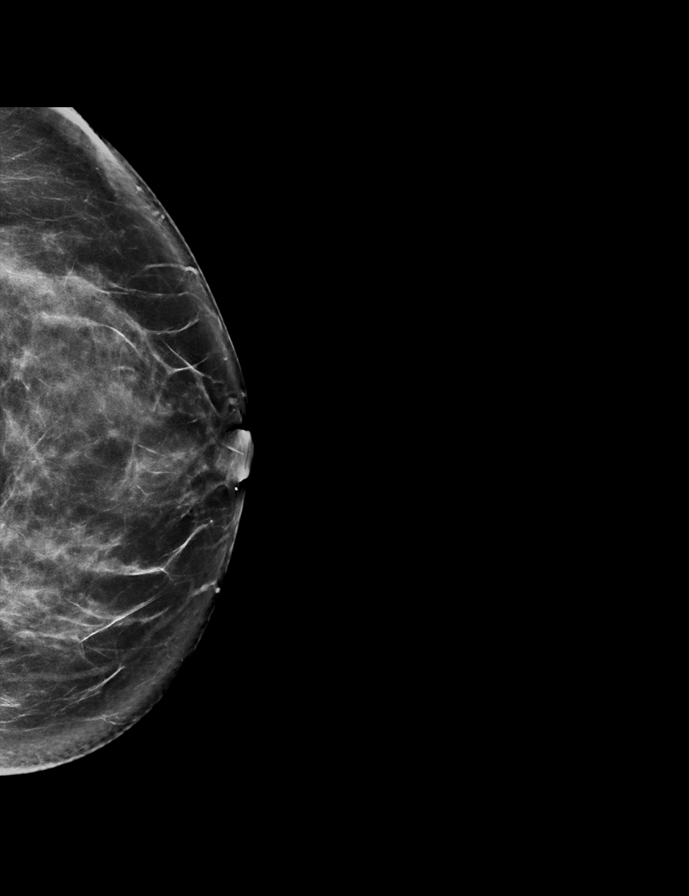

[L MLO synth-2D]
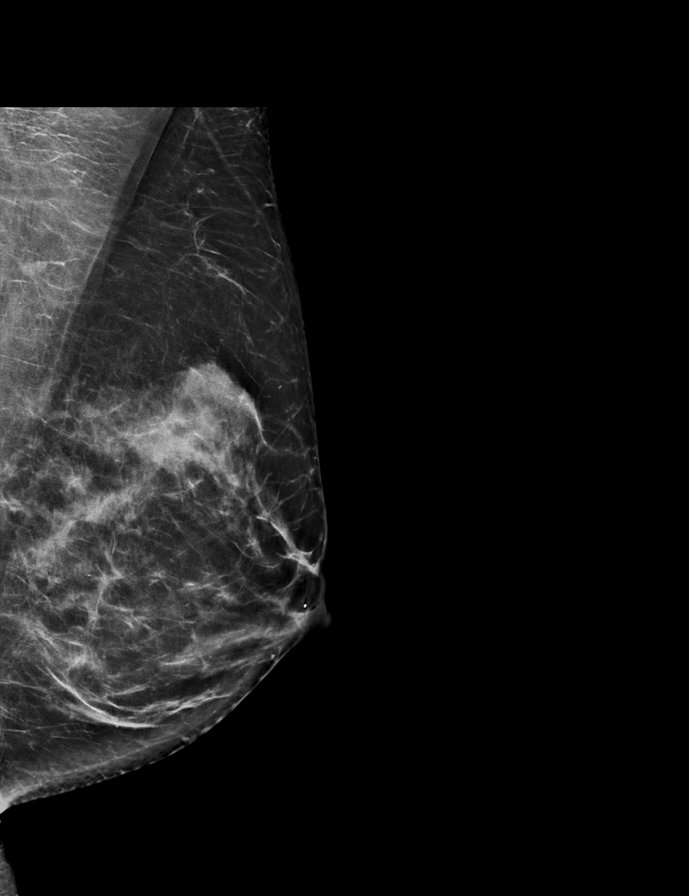

[R CC tomo · 2 of 70 frames shown]
[frame 23/70]
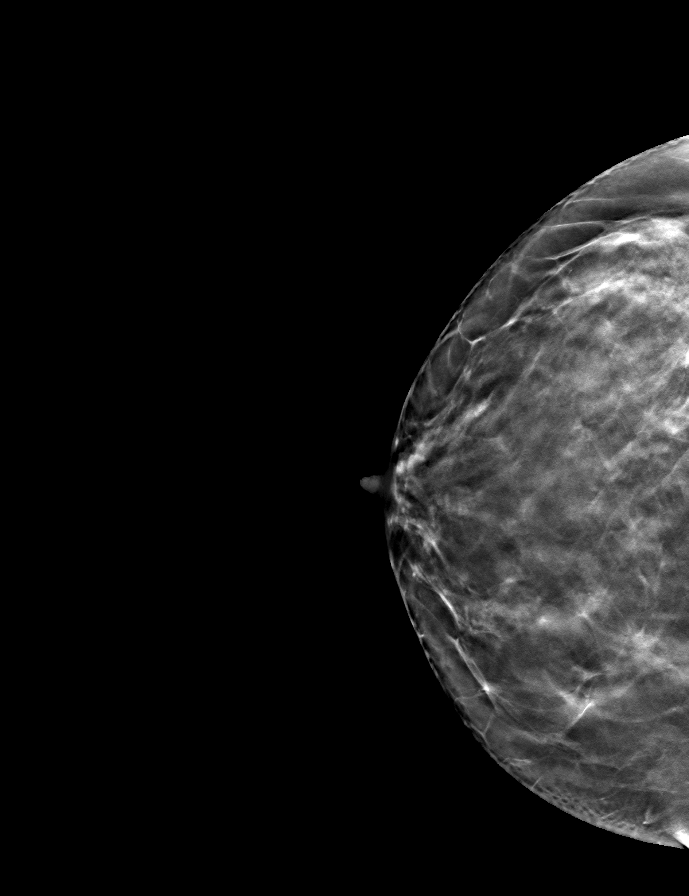
[frame 35/70]
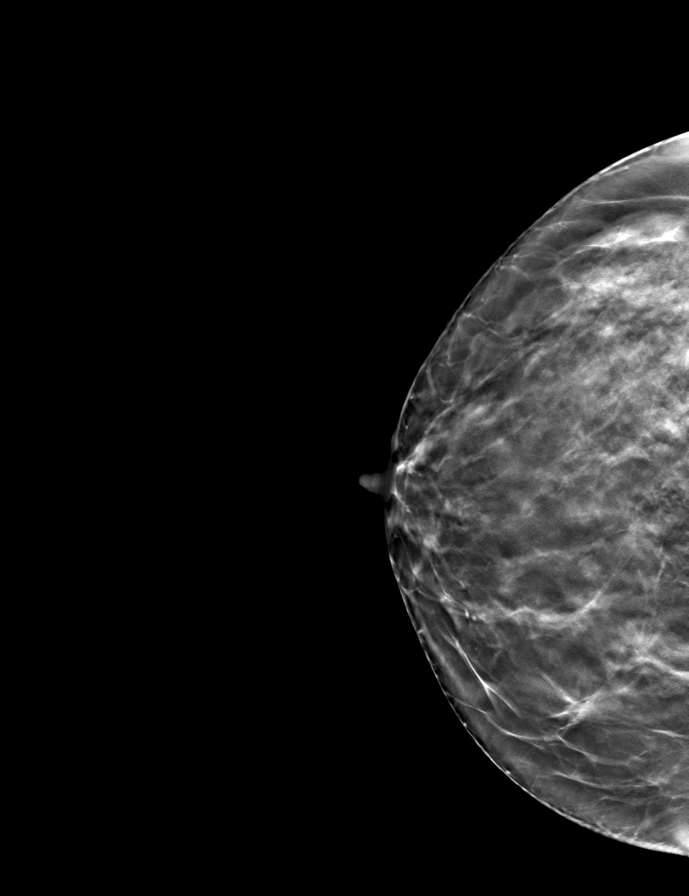

[R MLO tomo · tomo slice 39/77.0]
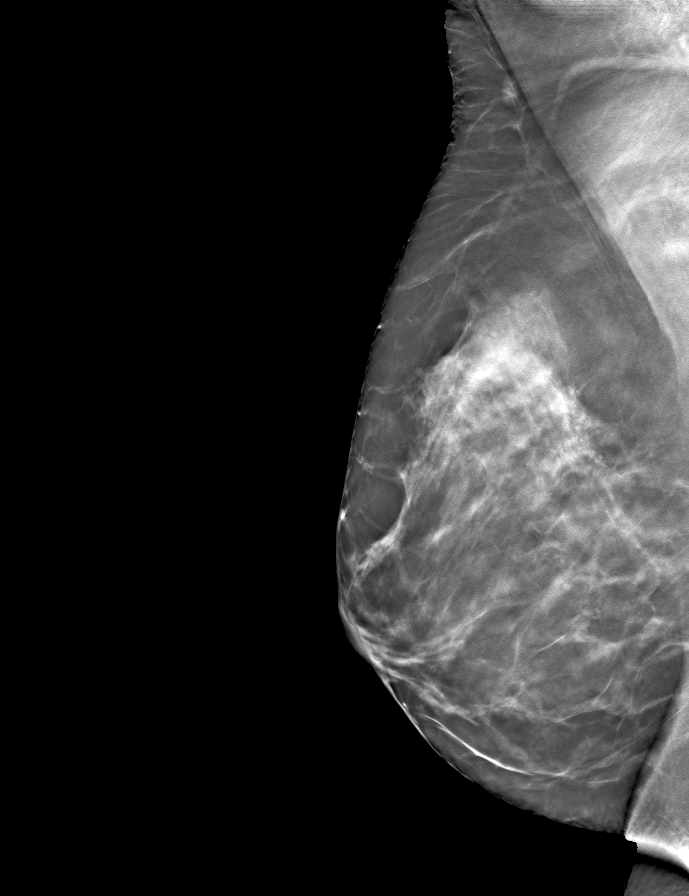

[L CC tomo · tomo slice 41/81.0]
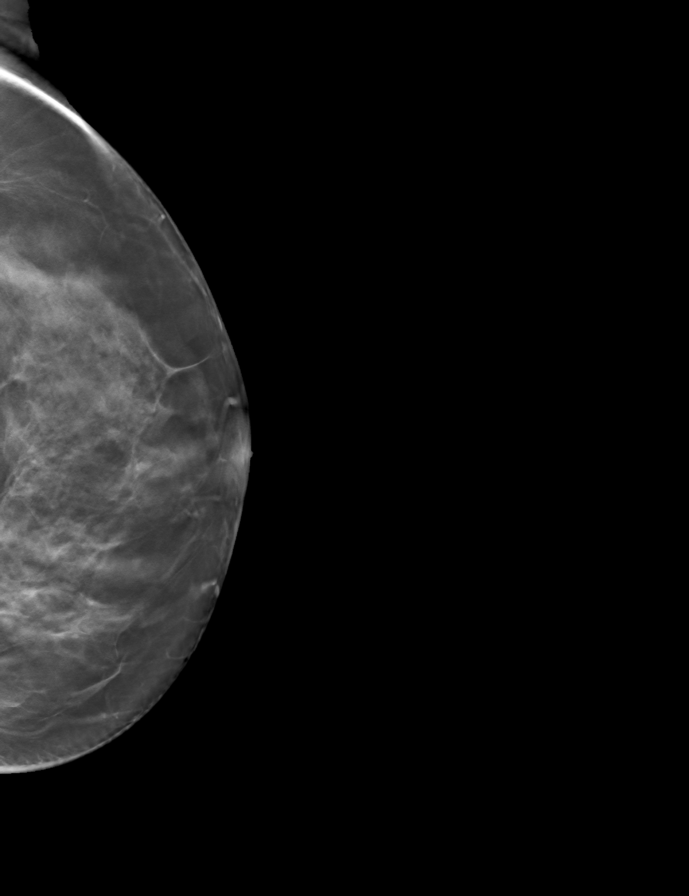

[L MLO tomo · tomo slice 37/72.0]
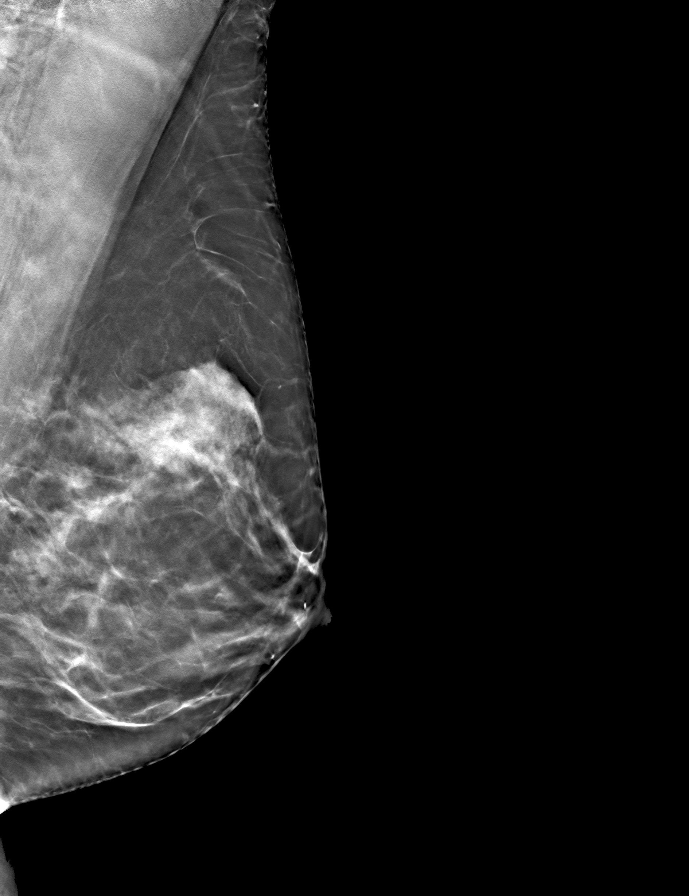

[9 of 24 positions shown; findings below may reference images not displayed]

ACR Breast Density Category c: The breast tissue is heterogeneously
dense, which may obscure small masses.
FINDINGS: There are no findings suspicious for malignancy.
IMPRESSION: No mammographic evidence of malignancy. A result letter of this
screening mammogram will be mailed directly to the patient.

RECOMMENDATION:
Screening mammogram in one year. (Code:Q3-W-BC3)

BI-RADS CATEGORY  1: Negative.

## 2023-08-04 ENCOUNTER — Other Ambulatory Visit (HOSPITAL_COMMUNITY): Payer: Self-pay

## 2023-10-14 ENCOUNTER — Other Ambulatory Visit (HOSPITAL_COMMUNITY): Payer: Self-pay

## 2023-10-20 ENCOUNTER — Other Ambulatory Visit (HOSPITAL_COMMUNITY): Payer: Self-pay

## 2023-11-08 DIAGNOSIS — D2272 Melanocytic nevi of left lower limb, including hip: Secondary | ICD-10-CM | POA: Diagnosis not present

## 2023-11-08 DIAGNOSIS — D2262 Melanocytic nevi of left upper limb, including shoulder: Secondary | ICD-10-CM | POA: Diagnosis not present

## 2023-11-08 DIAGNOSIS — L720 Epidermal cyst: Secondary | ICD-10-CM | POA: Diagnosis not present

## 2023-11-08 DIAGNOSIS — D2271 Melanocytic nevi of right lower limb, including hip: Secondary | ICD-10-CM | POA: Diagnosis not present

## 2023-11-08 DIAGNOSIS — D2239 Melanocytic nevi of other parts of face: Secondary | ICD-10-CM | POA: Diagnosis not present

## 2023-11-08 DIAGNOSIS — L819 Disorder of pigmentation, unspecified: Secondary | ICD-10-CM | POA: Diagnosis not present

## 2023-11-08 DIAGNOSIS — L821 Other seborrheic keratosis: Secondary | ICD-10-CM | POA: Diagnosis not present

## 2023-11-08 DIAGNOSIS — D225 Melanocytic nevi of trunk: Secondary | ICD-10-CM | POA: Diagnosis not present

## 2023-11-08 DIAGNOSIS — L918 Other hypertrophic disorders of the skin: Secondary | ICD-10-CM | POA: Diagnosis not present

## 2023-11-08 DIAGNOSIS — D2261 Melanocytic nevi of right upper limb, including shoulder: Secondary | ICD-10-CM | POA: Diagnosis not present

## 2023-12-05 ENCOUNTER — Other Ambulatory Visit (HOSPITAL_COMMUNITY): Payer: Self-pay

## 2023-12-05 MED ORDER — ROSUVASTATIN CALCIUM 10 MG PO TABS
10.0000 mg | ORAL_TABLET | Freq: Every day | ORAL | 0 refills | Status: DC
  Filled 2023-12-05: qty 90, 90d supply, fill #0

## 2024-03-12 ENCOUNTER — Other Ambulatory Visit (HOSPITAL_COMMUNITY): Payer: Self-pay

## 2024-03-15 ENCOUNTER — Other Ambulatory Visit (HOSPITAL_COMMUNITY): Payer: Self-pay

## 2024-03-20 ENCOUNTER — Encounter (HOSPITAL_COMMUNITY): Payer: Self-pay

## 2024-03-20 ENCOUNTER — Other Ambulatory Visit (HOSPITAL_COMMUNITY): Payer: Self-pay

## 2024-03-20 MED ORDER — UBRELVY 50 MG PO TABS
50.0000 mg | ORAL_TABLET | Freq: Every day | ORAL | 11 refills | Status: AC
  Filled 2024-03-20: qty 9, 21d supply, fill #0
  Filled 2024-07-03: qty 9, 21d supply, fill #1
  Filled 2024-07-30: qty 9, 21d supply, fill #2
  Filled 2024-07-31: qty 9, 21d supply, fill #0
  Filled 2024-09-18: qty 9, 21d supply, fill #1

## 2024-03-21 ENCOUNTER — Other Ambulatory Visit (HOSPITAL_COMMUNITY): Payer: Self-pay

## 2024-03-29 DIAGNOSIS — Z0000 Encounter for general adult medical examination without abnormal findings: Secondary | ICD-10-CM | POA: Diagnosis not present

## 2024-04-09 ENCOUNTER — Other Ambulatory Visit (HOSPITAL_COMMUNITY): Payer: Self-pay

## 2024-04-09 DIAGNOSIS — E782 Mixed hyperlipidemia: Secondary | ICD-10-CM | POA: Diagnosis not present

## 2024-04-09 DIAGNOSIS — Z0000 Encounter for general adult medical examination without abnormal findings: Secondary | ICD-10-CM | POA: Diagnosis not present

## 2024-04-09 DIAGNOSIS — G43009 Migraine without aura, not intractable, without status migrainosus: Secondary | ICD-10-CM | POA: Diagnosis not present

## 2024-04-09 MED ORDER — ROSUVASTATIN CALCIUM 20 MG PO TABS
20.0000 mg | ORAL_TABLET | Freq: Every day | ORAL | 3 refills | Status: AC
Start: 2024-04-09 — End: ?
  Filled 2024-04-09 – 2024-07-04 (×2): qty 90, 90d supply, fill #0

## 2024-06-07 ENCOUNTER — Encounter: Payer: Self-pay | Admitting: Obstetrics & Gynecology

## 2024-06-07 ENCOUNTER — Ambulatory Visit (INDEPENDENT_AMBULATORY_CARE_PROVIDER_SITE_OTHER): Admitting: Obstetrics & Gynecology

## 2024-06-07 ENCOUNTER — Other Ambulatory Visit (HOSPITAL_COMMUNITY)
Admission: RE | Admit: 2024-06-07 | Discharge: 2024-06-07 | Disposition: A | Source: Ambulatory Visit | Attending: Obstetrics & Gynecology | Admitting: Obstetrics & Gynecology

## 2024-06-07 VITALS — BP 122/77 | HR 94 | Ht 66.0 in | Wt 160.0 lb

## 2024-06-07 DIAGNOSIS — Z01419 Encounter for gynecological examination (general) (routine) without abnormal findings: Secondary | ICD-10-CM | POA: Insufficient documentation

## 2024-06-07 DIAGNOSIS — Z1231 Encounter for screening mammogram for malignant neoplasm of breast: Secondary | ICD-10-CM

## 2024-06-07 DIAGNOSIS — Z1211 Encounter for screening for malignant neoplasm of colon: Secondary | ICD-10-CM | POA: Diagnosis not present

## 2024-06-07 NOTE — Progress Notes (Signed)
 GYNECOLOGY ANNUAL PREVENTATIVE CARE ENCOUNTER NOTE  History:    Rachael King is a 48 y.o. G63P2002 female here for a routine annual gynecologic exam.  Current complaints: none.  No perimenopausal symptoms reported, just some hair loss noted periodically.   Denies abnormal vaginal bleeding, discharge, pelvic pain, problems with intercourse or other gynecologic concerns.  Gynecologic History Patient's last menstrual period was 03/28/2024 (approximate). Contraception: none Last Pap: 05/11/23. Result was normal with negative HPV Last Mammogram: 06/07/23.  Result was normal Last Cologuard: 07/29/21.  Result was normal  Obstetric History OB History  Gravida Para Term Preterm AB Living  2 2 2  0 0 2  SAB IAB Ectopic Multiple Live Births  0 0 0 0 2    # Outcome Date GA Lbr Len/2nd Weight Sex Type Anes PTL Lv  2 Term 09/23/11 [redacted]w[redacted]d 17:20 / 00:29 7 lb 3.3 oz (3.27 kg) F Vag-Spont EPI  LIV  1 Term     F Vag-Spont EPI N LIV    Past Medical History:  Diagnosis Date   No pertinent past medical history     Past Surgical History:  Procedure Laterality Date   BUNIONECTOMY Left 01/30/2016   [place] bunionectomy with screw fixation left foot second metatarsal osteotomy with double screw fixation left foot.   MANDIBLE RECONSTRUCTION     WISDOM TOOTH EXTRACTION      Current Outpatient Medications on File Prior to Visit  Medication Sig Dispense Refill   Coenzyme Q10 (COQ10) 200 MG CAPS      Multiple Vitamin (MULTIVITAMIN) tablet Take 1 tablet by mouth daily.     rosuvastatin  (CRESTOR ) 20 MG tablet Take 1 tablet (20 mg total) by mouth daily. 90 tablet 3   Ubrogepant  (UBRELVY ) 50 MG TABS Take one pill with onset of migraine headache may repeat in 2 hours one time if needed for persistent migraine headache 9 tablet 11   rosuvastatin  (CRESTOR ) 10 MG tablet Take 1 tablet (10 mg total) by mouth daily. (Patient not taking: Reported on 06/07/2024) 90 tablet 3   Ubrogepant  (UBRELVY ) 50 MG TABS Take 1  tablet (50 mg total) by mouth with onset of migraine headache. May repeat in 2 hours once if needed. 9 tablet 11   No current facility-administered medications on file prior to visit.    No Known Allergies  Social History:  reports that she has never smoked. She has never used smokeless tobacco. She reports current alcohol use. She reports that she does not use drugs.  Family History  Problem Relation Age of Onset   Heart disease Father        A FIB   Hypertension Mother        OPEN HEART SURGERY    The following portions of the patient's history were reviewed and updated as appropriate: allergies, current medications, past family history, past medical history, past social history, past surgical history and problem list.  Review of Systems Pertinent items noted in HPI and remainder of comprehensive ROS otherwise negative.  Physical Exam:  BP 122/77   Pulse 94   Ht 5' 6 (1.676 m)   Wt 160 lb (72.6 kg)   LMP 03/28/2024 (Approximate)   BMI 25.82 kg/m  CONSTITUTIONAL: Well-developed, well-nourished female in no acute distress.  HENT:  Normocephalic, atraumatic, External right and left ear normal.  EYES: Conjunctivae and EOM are normal. Pupils are equal, round, and reactive to light. No scleral icterus.  NECK: Normal range of motion, supple, no masses observed. SKIN:  Skin is warm and dry. No rash noted. Not diaphoretic. No erythema. No pallor. MUSCULOSKELETAL: Normal range of motion. No tenderness.  No cyanosis, clubbing, or edema. NEUROLOGIC: Alert and oriented to person, place, and time. Normal muscle tone coordination.  PSYCHIATRIC: Normal mood and affect. Normal behavior. Normal judgment and thought content. CARDIOVASCULAR: Normal heart rate noted, regular rhythm RESPIRATORY: Clear to auscultation bilaterally. Effort and breath sounds normal, no problems with respiration noted. BREASTS: Symmetric in size. No masses, tenderness, skin changes, nipple drainage, or lymphadenopathy  bilaterally. Performed in the presence of a chaperone. ABDOMEN: Soft, no distention noted.  No tenderness, rebound or guarding.  PELVIC: Normal appearing external genitalia and urethral meatus; normal appearing vaginal mucosa and cervix.  No abnormal vaginal discharge noted.  Pap smear obtained.  Normal uterine size, no other palpable masses, no uterine or adnexal tenderness.  Performed in the presence of a chaperone.  Assessment and Plan:    1. Colon cancer screening - Cologuard reordered, patient declined colonoscopy. Emphasized that positive Cologuard tests will need to be follow up with diagnostic colonoscopy.   2. Encounter for screening mammogram for breast cancer Mammogram scheduled for breast cancer screening. - MM 3D SCREENING MAMMOGRAM BILATERAL BREAST; Future  3. Well woman exam with routine gynecological exam (Primary) - Cytology - PAP Will follow up results of pap smear and manage accordingly. Routine preventative health maintenance measures emphasized. Please refer to After Visit Summary for other counseling recommendations.      GLORIS HUGGER, MD, FACOG Obstetrician & Gynecologist, [facility] for [employer], [facility]

## 2024-06-11 ENCOUNTER — Ambulatory Visit: Payer: Self-pay | Admitting: Obstetrics & Gynecology

## 2024-06-11 LAB — CYTOLOGY - PAP
Comment: NEGATIVE
Diagnosis: NEGATIVE
High risk HPV: NEGATIVE

## 2024-06-14 ENCOUNTER — Encounter (HOSPITAL_COMMUNITY): Payer: Self-pay

## 2024-06-14 ENCOUNTER — Ambulatory Visit (HOSPITAL_COMMUNITY)
Admission: RE | Admit: 2024-06-14 | Discharge: 2024-06-14 | Disposition: A | Discharge status: Home / Self Care | Source: Ambulatory Visit | Attending: Obstetrics & Gynecology | Admitting: Obstetrics & Gynecology

## 2024-06-14 DIAGNOSIS — Z1231 Encounter for screening mammogram for malignant neoplasm of breast: Secondary | ICD-10-CM | POA: Insufficient documentation

## 2024-06-24 ENCOUNTER — Telehealth

## 2024-06-24 DIAGNOSIS — R3989 Other symptoms and signs involving the genitourinary system: Secondary | ICD-10-CM

## 2024-06-25 ENCOUNTER — Other Ambulatory Visit (HOSPITAL_COMMUNITY): Payer: Self-pay

## 2024-06-25 MED ORDER — CEPHALEXIN 500 MG PO CAPS
500.0000 mg | ORAL_CAPSULE | Freq: Two times a day (BID) | ORAL | 0 refills | Status: AC
  Filled 2024-06-25: qty 14, 7d supply, fill #0

## 2024-06-25 NOTE — Progress Notes (Signed)

## 2024-07-04 ENCOUNTER — Other Ambulatory Visit (HOSPITAL_COMMUNITY): Payer: Self-pay

## 2024-07-04 ENCOUNTER — Encounter (HOSPITAL_COMMUNITY): Payer: Self-pay | Admitting: Pharmacist

## 2024-07-29 DIAGNOSIS — Z1211 Encounter for screening for malignant neoplasm of colon: Secondary | ICD-10-CM | POA: Diagnosis not present

## 2024-07-31 ENCOUNTER — Other Ambulatory Visit (HOSPITAL_COMMUNITY): Payer: Self-pay

## 2024-07-31 ENCOUNTER — Other Ambulatory Visit: Payer: Self-pay

## 2024-08-01 ENCOUNTER — Other Ambulatory Visit (HOSPITAL_COMMUNITY): Payer: Self-pay

## 2024-08-04 LAB — COLOGUARD: COLOGUARD: NEGATIVE

## 2024-09-18 ENCOUNTER — Other Ambulatory Visit (HOSPITAL_COMMUNITY): Payer: Self-pay

## 2024-10-02 ENCOUNTER — Ambulatory Visit (HOSPITAL_BASED_OUTPATIENT_CLINIC_OR_DEPARTMENT_OTHER): Admitting: Family Medicine

## 2024-11-09 ENCOUNTER — Ambulatory Visit (HOSPITAL_BASED_OUTPATIENT_CLINIC_OR_DEPARTMENT_OTHER): Admitting: Family Medicine
